# Patient Record
Sex: Male | Born: 1955
Health system: Southern US, Community
[De-identification: ages and names within clinical notes are randomized; demographics above are authoritative.]

## PROBLEM LIST (undated history)

## (undated) DIAGNOSIS — I10 Essential (primary) hypertension: Secondary | ICD-10-CM

## (undated) DIAGNOSIS — E785 Hyperlipidemia, unspecified: Secondary | ICD-10-CM

## (undated) DIAGNOSIS — N289 Disorder of kidney and ureter, unspecified: Secondary | ICD-10-CM

## (undated) DIAGNOSIS — M1990 Unspecified osteoarthritis, unspecified site: Secondary | ICD-10-CM

## (undated) DIAGNOSIS — E611 Iron deficiency: Secondary | ICD-10-CM

## (undated) DIAGNOSIS — E039 Hypothyroidism, unspecified: Secondary | ICD-10-CM

## (undated) DIAGNOSIS — E119 Type 2 diabetes mellitus without complications: Secondary | ICD-10-CM

## (undated) HISTORY — DX: Iron deficiency: E61.1

## (undated) HISTORY — DX: Hyperlipidemia, unspecified: E78.5

## (undated) HISTORY — DX: Type 2 diabetes mellitus without complications: E11.9

## (undated) HISTORY — DX: Hypothyroidism, unspecified: E03.9

## (undated) HISTORY — DX: Unspecified osteoarthritis, unspecified site: M19.90

## (undated) HISTORY — DX: Disorder of kidney and ureter, unspecified: N28.9

---
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
## 2007-04-11 ENCOUNTER — Ambulatory Visit: Payer: Self-pay | Admitting: Nurse Practitioner

## 2007-04-11 ENCOUNTER — Ambulatory Visit: Payer: Self-pay | Admitting: *Deleted

## 2007-04-11 ENCOUNTER — Inpatient Hospital Stay (HOSPITAL_COMMUNITY): Admission: EM | Admit: 2007-04-11 | Discharge: 2007-04-12 | Payer: Self-pay | Admitting: Emergency Medicine

## 2007-04-13 ENCOUNTER — Ambulatory Visit: Payer: Self-pay | Admitting: *Deleted

## 2007-04-19 ENCOUNTER — Ambulatory Visit: Payer: Self-pay | Admitting: Nurse Practitioner

## 2007-12-04 ENCOUNTER — Ambulatory Visit: Payer: Self-pay | Admitting: Internal Medicine

## 2007-12-04 ENCOUNTER — Encounter (INDEPENDENT_AMBULATORY_CARE_PROVIDER_SITE_OTHER): Payer: Self-pay | Admitting: Nurse Practitioner

## 2007-12-04 LAB — CONVERTED CEMR LAB
AST: 29 units/L (ref 0–37)
Alkaline Phosphatase: 78 units/L (ref 39–117)
BUN: 15 mg/dL (ref 6–23)
Basophils Absolute: 0.1 10*3/uL (ref 0.0–0.1)
Basophils Relative: 1 % (ref 0–1)
Calcium: 9.7 mg/dL (ref 8.4–10.5)
Creatinine, Ser: 1.04 mg/dL (ref 0.40–1.50)
Eosinophils Absolute: 0.3 10*3/uL (ref 0.0–0.7)
Eosinophils Relative: 6 % — ABNORMAL HIGH (ref 0–5)
Hemoglobin: 13.4 g/dL (ref 13.0–17.0)
MCHC: 33.9 g/dL (ref 30.0–36.0)
MCV: 95 fL (ref 78.0–100.0)
Microalb, Ur: 0.2 mg/dL (ref 0.00–1.89)
Monocytes Absolute: 0.3 10*3/uL (ref 0.1–1.0)
Monocytes Relative: 5 % (ref 3–12)
RBC: 4.16 M/uL — ABNORMAL LOW (ref 4.22–5.81)
RDW: 13.8 % (ref 11.5–15.5)
Total Bilirubin: 0.5 mg/dL (ref 0.3–1.2)

## 2007-12-10 ENCOUNTER — Ambulatory Visit: Payer: Self-pay | Admitting: Internal Medicine

## 2007-12-10 ENCOUNTER — Encounter (INDEPENDENT_AMBULATORY_CARE_PROVIDER_SITE_OTHER): Payer: Self-pay | Admitting: Nurse Practitioner

## 2007-12-10 LAB — CONVERTED CEMR LAB
Cholesterol: 159 mg/dL (ref 0–200)
HDL: 36 mg/dL — ABNORMAL LOW (ref >39)
Total CHOL/HDL Ratio: 4.4
Triglycerides: 177 mg/dL — ABNORMAL HIGH (ref <150)

## 2008-03-06 ENCOUNTER — Ambulatory Visit: Payer: Self-pay | Admitting: Internal Medicine

## 2008-06-26 ENCOUNTER — Ambulatory Visit: Payer: Self-pay | Admitting: Internal Medicine

## 2008-06-26 ENCOUNTER — Encounter (INDEPENDENT_AMBULATORY_CARE_PROVIDER_SITE_OTHER): Payer: Self-pay | Admitting: Family Medicine

## 2008-06-26 LAB — CONVERTED CEMR LAB: Free T4: 0.67 ng/dL — ABNORMAL LOW (ref 0.89–1.80)

## 2008-07-15 ENCOUNTER — Ambulatory Visit: Payer: Self-pay | Admitting: Internal Medicine

## 2008-08-11 ENCOUNTER — Ambulatory Visit: Payer: Self-pay | Admitting: Family Medicine

## 2008-08-11 LAB — CONVERTED CEMR LAB: TSH: 9.824 microintl units/mL — ABNORMAL HIGH (ref 0.350–4.50)

## 2009-01-21 ENCOUNTER — Ambulatory Visit: Payer: Self-pay | Admitting: Family Medicine

## 2009-01-21 ENCOUNTER — Encounter (INDEPENDENT_AMBULATORY_CARE_PROVIDER_SITE_OTHER): Payer: Self-pay | Admitting: Internal Medicine

## 2009-01-21 LAB — CONVERTED CEMR LAB
ALT: 22 units/L (ref 0–53)
CO2: 23 meq/L (ref 19–32)
Potassium: 3.8 meq/L (ref 3.5–5.3)
Sodium: 144 meq/L (ref 135–145)
TSH: 3.908 microintl units/mL (ref 0.350–4.50)
Total Bilirubin: 0.7 mg/dL (ref 0.3–1.2)
Total Protein: 7.7 g/dL (ref 6.0–8.3)

## 2009-02-24 ENCOUNTER — Ambulatory Visit: Payer: Self-pay | Admitting: Internal Medicine

## 2009-02-24 ENCOUNTER — Encounter (INDEPENDENT_AMBULATORY_CARE_PROVIDER_SITE_OTHER): Payer: Self-pay | Admitting: Internal Medicine

## 2009-02-24 LAB — CONVERTED CEMR LAB
Cholesterol: 173 mg/dL (ref 0–200)
Total CHOL/HDL Ratio: 3.6
Triglycerides: 126 mg/dL (ref <150)
VLDL: 25 mg/dL (ref 0–40)

## 2009-04-20 ENCOUNTER — Ambulatory Visit: Payer: Self-pay | Admitting: Internal Medicine

## 2009-12-01 ENCOUNTER — Ambulatory Visit: Payer: Self-pay | Admitting: Internal Medicine

## 2009-12-01 LAB — CONVERTED CEMR LAB
ALT: 18 units/L (ref 0–53)
Alkaline Phosphatase: 75 units/L (ref 39–117)
Basophils Absolute: 0 10*3/uL (ref 0.0–0.1)
Basophils Relative: 1 % (ref 0–1)
LDL Cholesterol: 108 mg/dL — ABNORMAL HIGH (ref 0–99)
MCHC: 34.6 g/dL (ref 30.0–36.0)
Neutro Abs: 2.1 10*3/uL (ref 1.7–7.7)
Neutrophils Relative %: 49 % (ref 43–77)
PSA: 0.34 ng/mL (ref 0.10–4.00)
RBC: 4.4 M/uL (ref 4.22–5.81)
RDW: 13.4 % (ref 11.5–15.5)
Sodium: 137 meq/L (ref 135–145)
Total Bilirubin: 0.8 mg/dL (ref 0.3–1.2)
Total Protein: 7.4 g/dL (ref 6.0–8.3)
Triglycerides: 233 mg/dL — ABNORMAL HIGH (ref <150)
VLDL: 47 mg/dL — ABNORMAL HIGH (ref 0–40)

## 2010-01-13 ENCOUNTER — Ambulatory Visit: Payer: Self-pay | Admitting: Internal Medicine

## 2010-01-20 ENCOUNTER — Ambulatory Visit: Payer: Self-pay | Admitting: Internal Medicine

## 2010-05-14 ENCOUNTER — Ambulatory Visit: Payer: Self-pay | Admitting: Internal Medicine

## 2010-06-08 ENCOUNTER — Encounter (INDEPENDENT_AMBULATORY_CARE_PROVIDER_SITE_OTHER): Payer: Self-pay | Admitting: Family Medicine

## 2010-06-08 ENCOUNTER — Ambulatory Visit: Payer: Self-pay | Admitting: Internal Medicine

## 2010-06-08 LAB — CONVERTED CEMR LAB
Calcium: 9.3 mg/dL (ref 8.4–10.5)
Creatinine, Ser: 1.1 mg/dL (ref 0.40–1.50)
Glucose, Bld: 268 mg/dL — ABNORMAL HIGH (ref 70–99)
Sodium: 137 meq/L (ref 135–145)
TSH: 1.548 microintl units/mL (ref 0.350–4.500)

## 2010-07-13 ENCOUNTER — Ambulatory Visit: Payer: Self-pay | Admitting: Internal Medicine

## 2010-07-13 LAB — HM DIABETES EYE EXAM

## 2010-09-21 ENCOUNTER — Ambulatory Visit: Payer: Self-pay | Admitting: Family Medicine

## 2010-10-08 ENCOUNTER — Encounter (INDEPENDENT_AMBULATORY_CARE_PROVIDER_SITE_OTHER): Payer: Self-pay | Admitting: Family Medicine

## 2011-04-12 NOTE — Discharge Summary (Signed)
NAMEHAYWARD, CASS NO.:  [id number]   MEDICAL RECORD NO.:  [id number]          PATIENT TYPE:  INP   LOCATION:  [id number]                         FACILITY:  [facility]   PHYSICIAN:  Bartholomew Boards, M.D.     DATE OF BIRTH:  1956-05-19   DATE OF ADMISSION:  04/11/2007  DATE OF DISCHARGE:  04/12/2007                               DISCHARGE SUMMARY   CONTINUITY PHYSICIAN:  Dr. Simeon Craft at the [facility] on [facility].   DISCHARGE DIAGNOSES:  1. Diabetes mellitus type 2, new diagnosis.  2. Hypothyroidism, new diagnosis.   DISCHARGE MEDICATIONS:  1. Metformin 500 mg p.o. b.i.d.  2. Synthroid 500 mcg p.o. daily.   FOLLOWUP:  The patient is to follow up with Dr. Simeon Craft at [facility] on  Apr 19, 2007 at 10:45 a.m.  At that time, he will need to have his blood  sugar checked to evaluate for increasing his metformin, as well as  rechecking a TSH to check and see if his Synthroid is adequately dosed.   BRIEF ADMITTING H&P:  The patient is a 55 year old man with no past  medical history, but with a significant family history of diabetes  mellitus type 2, presenting with a 2 to 3 month history of increased  thirst, increased urination, weight loss, blurry vision, occasional  dizziness and occasional dizziness on standing.  The patient went to his  first doctor visit at [facility] where they found his blood sugar to be  unmeasurable.  He was subsequently told to go to the emergency  department to be evaluated.  His blood sugars had also been measuring  500 or more using his significant other's monitor for some time prior to  presentation.  The patient denied chest pain, shortness of breath,  nausea, vomiting or abdominal pain.   PHYSICAL EXAMINATION:  VITAL SIGNS:  Temperature 96.8, blood pressure  135/92, pulse of 68, respirations 18, O2 sat 99% on room air.  GENERAL:  The patient was in no acute distress.  HEENT:  PERRL.  EOMI.  Oropharynx is clear.  NECK:  Supple.   No lymphadenopathy.  LUNGS:  Clear to auscultation.  HEART:  Showed a regular rate and rhythm.  ABDOMEN:  Soft, nontender, nondistended with positive bowel sounds.  EXTREMITIES:  Revealed no clubbing, cyanosis or edema.  2+ distal  pulses.  Had no rashes or erythema.  NEURO:  Normal.  The patient was alert and oriented x3.  Cranial nerves  II-XII intact.  Sensorium was intact to pin prick, as well as soft  touch.  Cerebellar signs were intact, Romberg was downgoing reflexes 2+  and symmetric.  The patient was appropriate and very pleasant.   ADMISSION LABORATORY DATA:  Sodium 127, potassium 4.4, chloride 93,  bicarb 26, BUN 15, creatinine 1.42 with a glucose of 810.  White count  3.6, hemoglobin 11.3, platelets 130.   HOSPITAL COURSE:  1. Diabetes mellitus type 2, new diagnosis:  The patient was      significantly hyperglycemic on admission, but he had no evidence of      GAP on his  laboratories and his bicarb was within normal limits.      He did not appear to be in hyperosmolar nonketotic coma, as his      serum osmolality was also within normal limits of 301.  However,      the patient was severely dehydrated due to his osmotic diuresis.      The patient had BMETs checked every 4 hours to monitor for his      potassium, checked CBGs every one hour.  He was given 6 units of      insulin immediately, and then started on an insulin drip and given      IV fluid resuscitation.  Once his blood sugar came down into the      200s, his insulin drip was discontinued and he was given 10 units      of Lantus and coupled with sliding scale.  On day 2, he was      transitioned to p.o. medications, including metformin 500 mg p.o.      b.i.d., and he is to followup with his outpatient primary care      physician to evaluate this regimen.  2. Hypothyroidism.  The patient was anemic on presentation, and a full      anemia workup was completed, complete with iron studies, RBC folate      and B12,  all which were normal, and then also tested a TSH, which      was highly elevated at 55.  Therefore, he was immediately started      on Synthroid 500 mcg p.o. daily, and this is to be followed up by      his outpatient clinic [facility].  3. Anemia:  The patient's hemoglobin was 11.8 on admission.  After      extensive workup, including iron studies, RBC folate and B12, all      of which were normal, it was concluded that this was secondary to      his hypothyroidism.  This should be followed up by his primary care      physician.  We assume that his blood counts will come up as his      hypothyroidism is treated.  4. Acute renal failure:  On admission, the patient's creatinine was      1.6.  After aggressive fluid resuscitation and treating his      hyperglycemia, his creatinine came down to 1.15 at discharge.   DISCHARGE LABORATORY DATA:  Sodium of 136, potassium 4.3, chloride 108,  bicarb 22, glucose 166, BUN 8, creatinine 1.15.  White blood count was  3.6, hemoglobin 10.4, platelets 118.  Free T4 was 0.46, which is low.  Microalbumin to creatinine ratio was 3.8.      Bartholomew Boards, M.D.  Electronically Signed     CA/MEDQ  D:  04/13/2007  T:  04/13/2007  Job:  [id number]

## 2012-12-27 ENCOUNTER — Encounter: Payer: Self-pay | Admitting: Internal Medicine

## 2012-12-27 ENCOUNTER — Ambulatory Visit (INDEPENDENT_AMBULATORY_CARE_PROVIDER_SITE_OTHER): Payer: 59 | Admitting: Internal Medicine

## 2012-12-27 ENCOUNTER — Other Ambulatory Visit (INDEPENDENT_AMBULATORY_CARE_PROVIDER_SITE_OTHER): Payer: 59

## 2012-12-27 VITALS — BP 130/88 | HR 76 | Temp 97.6°F | Ht 65.35 in | Wt 179.4 lb

## 2012-12-27 DIAGNOSIS — Z13220 Encounter for screening for lipoid disorders: Secondary | ICD-10-CM

## 2012-12-27 DIAGNOSIS — Z0000 Encounter for general adult medical examination without abnormal findings: Secondary | ICD-10-CM

## 2012-12-27 DIAGNOSIS — Z130 Encounter for screening for diseases of the blood and blood-forming organs and certain disorders involving the immune mechanism: Secondary | ICD-10-CM

## 2012-12-27 DIAGNOSIS — Z125 Encounter for screening for malignant neoplasm of prostate: Secondary | ICD-10-CM

## 2012-12-27 DIAGNOSIS — Z23 Encounter for immunization: Secondary | ICD-10-CM

## 2012-12-27 DIAGNOSIS — M129 Arthropathy, unspecified: Secondary | ICD-10-CM

## 2012-12-27 DIAGNOSIS — E119 Type 2 diabetes mellitus without complications: Secondary | ICD-10-CM

## 2012-12-27 DIAGNOSIS — E039 Hypothyroidism, unspecified: Secondary | ICD-10-CM

## 2012-12-27 DIAGNOSIS — Z1211 Encounter for screening for malignant neoplasm of colon: Secondary | ICD-10-CM

## 2012-12-27 DIAGNOSIS — M1990 Unspecified osteoarthritis, unspecified site: Secondary | ICD-10-CM | POA: Insufficient documentation

## 2012-12-27 DIAGNOSIS — G4700 Insomnia, unspecified: Secondary | ICD-10-CM

## 2012-12-27 LAB — MICROALBUMIN / CREATININE URINE RATIO
Creatinine,U: 108.4 mg/dL
Microalb, Ur: 0.6 mg/dL (ref 0.0–1.9)

## 2012-12-27 LAB — BASIC METABOLIC PANEL
BUN: 10 mg/dL (ref 6–23)
CO2: 29 mEq/L (ref 19–32)
Chloride: 104 mEq/L (ref 96–112)
GFR: 54.16 mL/min — ABNORMAL LOW (ref >60.00)
Glucose, Bld: 198 mg/dL — ABNORMAL HIGH (ref 70–99)
Potassium: 4.4 mEq/L (ref 3.5–5.1)

## 2012-12-27 LAB — CBC
MCV: 91.9 fl (ref 78.0–100.0)
Platelets: 158 10*3/uL (ref 150.0–400.0)
RBC: 4.13 Mil/uL — ABNORMAL LOW (ref 4.22–5.81)
WBC: 4.2 10*3/uL — ABNORMAL LOW (ref 4.5–10.5)

## 2012-12-27 LAB — HEMOGLOBIN A1C: Hgb A1c MFr Bld: 10.1 % — ABNORMAL HIGH (ref 4.6–6.5)

## 2012-12-27 LAB — LIPID PANEL
Total CHOL/HDL Ratio: 5
VLDL: 71.6 mg/dL — ABNORMAL HIGH (ref 0.0–40.0)

## 2012-12-27 MED ORDER — TRAMADOL HCL 50 MG PO TABS: 50.0000 mg | ORAL_TABLET | Freq: Three times a day (TID) | ORAL | Status: DC | PRN

## 2012-12-27 MED ORDER — ALPRAZOLAM 1 MG PO TABS: 1.0000 mg | ORAL_TABLET | Freq: Every evening | ORAL | Status: DC | PRN

## 2012-12-27 MED ORDER — METFORMIN HCL 1000 MG PO TABS: 1000.0000 mg | ORAL_TABLET | Freq: Two times a day (BID) | ORAL | Status: DC

## 2012-12-27 MED ORDER — GLIPIZIDE ER 10 MG PO TB24: 10.0000 mg | ORAL_TABLET | Freq: Every morning | ORAL | Status: DC

## 2012-12-27 MED ORDER — MELOXICAM 7.5 MG PO TABS: 15.0000 mg | ORAL_TABLET | Freq: Every day | ORAL | Status: DC

## 2012-12-27 NOTE — Assessment & Plan Note (Signed)
will check HgbA1c and urine microalbumin today Continue metformin and glipizide Continue to check sugars daily

## 2012-12-27 NOTE — Patient Instructions (Signed)
Health Maintenance, Males A healthy lifestyle and preventative care can promote health and wellness.  Maintain regular health, dental, and eye exams.  Eat a healthy diet. Foods like vegetables, fruits, whole grains, low-fat dairy products, and lean protein foods contain the nutrients you need without too many calories. Decrease your intake of foods high in solid fats, added sugars, and salt. Get information about a proper diet from your caregiver, if necessary.  Regular physical exercise is one of the most important things you can do for your health. Most adults should get at least 150 minutes of moderate-intensity exercise (any activity that increases your heart rate and causes you to sweat) each week. In addition, most adults need muscle-strengthening exercises on 2 or more days a week.   Maintain a healthy weight. The body mass index (BMI) is a screening tool to identify possible weight problems. It provides an estimate of body fat based on height and weight. Your caregiver can help determine your BMI, and can help you achieve or maintain a healthy weight. For adults 20 years and older:  A BMI below 18.5 is considered underweight.  A BMI of 18.5 to 24.9 is normal.  A BMI of 25 to 29.9 is considered overweight.  A BMI of 30 and above is considered obese.  Maintain normal blood lipids and cholesterol by exercising and minimizing your intake of saturated fat. Eat a balanced diet with plenty of fruits and vegetables. Blood tests for lipids and cholesterol should begin at age 20 and be repeated every 5 years. If your lipid or cholesterol levels are high, you are over 50, or you are a high risk for heart disease, you may need your cholesterol levels checked more frequently.Ongoing high lipid and cholesterol levels should be treated with medicines, if diet and exercise are not effective.  If you smoke, find out from your caregiver how to quit. If you do not use tobacco, do not start.  If you  choose to drink alcohol, do not exceed 2 drinks per day. One drink is considered to be 12 ounces (355 mL) of beer, 5 ounces (148 mL) of wine, or 1.5 ounces (44 mL) of liquor.  Avoid use of street drugs. Do not share needles with anyone. Ask for help if you need support or instructions about stopping the use of drugs.  High blood pressure causes heart disease and increases the risk of stroke. Blood pressure should be checked at least every 1 to 2 years. Ongoing high blood pressure should be treated with medicines if weight loss and exercise are not effective.  If you are 45 to 57 years old, ask your caregiver if you should take aspirin to prevent heart disease.  Diabetes screening involves taking a blood sample to check your fasting blood sugar level. This should be done once every 3 years, after age 45, if you are within normal weight and without risk factors for diabetes. Testing should be considered at a younger age or be carried out more frequently if you are overweight and have at least 1 risk factor for diabetes.  Colorectal cancer can be detected and often prevented. Most routine colorectal cancer screening begins at the age of 50 and continues through age 75. However, your caregiver may recommend screening at an earlier age if you have risk factors for colon cancer. On a yearly basis, your caregiver may provide home test kits to check for hidden blood in the stool. Use of a small camera at the end of a tube,   to directly examine the colon (sigmoidoscopy or colonoscopy), can detect the earliest forms of colorectal cancer. Talk to your caregiver about this at age 50, when routine screening begins. Direct examination of the colon should be repeated every 5 to 10 years through age 75, unless early forms of pre-cancerous polyps or small growths are found.  Hepatitis C blood testing is recommended for all people born from 1945 through 1965 and any individual with known risks for hepatitis C.  Healthy  men should no longer receive prostate-specific antigen (PSA) blood tests as part of routine cancer screening. Consult with your caregiver about prostate cancer screening.  Testicular cancer screening is not recommended for adolescents or adult males who have no symptoms. Screening includes self-exam, caregiver exam, and other screening tests. Consult with your caregiver about any symptoms you have or any concerns you have about testicular cancer.  Practice safe sex. Use condoms and avoid high-risk sexual practices to reduce the spread of sexually transmitted infections (STIs).  Use sunscreen with a sun protection factor (SPF) of 30 or greater. Apply sunscreen liberally and repeatedly throughout the day. You should seek shade when your shadow is shorter than you. Protect yourself by wearing long sleeves, pants, a wide-brimmed hat, and sunglasses year round, whenever you are outdoors.  Notify your caregiver of new moles or changes in moles, especially if there is a change in shape or color. Also notify your caregiver if a mole is larger than the size of a pencil eraser.  A one-time screening for abdominal aortic aneurysm (AAA) and surgical repair of large AAAs by sound wave imaging (ultrasonography) is recommended for ages 65 to 75 years who are current or former smokers.  Stay current with your immunizations. Document Released: 05/12/2008 Document Revised: 02/06/2012 Document Reviewed: 04/11/2011 Exit[employer] Patient Information 2013 [employer].  

## 2012-12-27 NOTE — Assessment & Plan Note (Signed)
  Will check TSH today Will restart Synthroid based on your levels

## 2012-12-27 NOTE — Assessment & Plan Note (Signed)
  Continue Mobic daily eRx for tramadol Do stretching exercises as shown on handout Will not prescribe oxycontin for chronic pain. If patient feels he needs this, will refer to pain management

## 2012-12-27 NOTE — Assessment & Plan Note (Addendum)
   Preventative Health Maintenance:  Start a diet and exercise program PT given flu, tdap, and pneumovax today Make an appointment to see an eye doctor Make an appointment to see a dentist Will set up for colonoscopy Basic screening labs today Take xanax 1 mg QHS prn for sleep Practice healthy sleep habits

## 2012-12-27 NOTE — Progress Notes (Signed)
HPI  Pt presents to the clinic today to establish care. He used to be seen at [facility] before they closed. He does have some concerns today about insomnia. He usually sleeps about 4 hours, wakes up to go to the bathroom but then cannot go back to sleep. He is starting to feel sleepy during the day. He has not taken anything to try to help with this. He has never had problems sleeping before. He does not feel stressed. He does have some concerns about chronic lower back pain that he was told was due to arthritis. He has been taking Mobic for it but states that it does not help. He stands up all day at work and states that his back hurts worse when bends over. He thinks he needs to be switched to oxycontin. Additionally, he does have DM. He checks is sugars daily. They run between 150-220. He ran out of his diabetes meds after [facility]. He has been getting glipizide and metformin from someone he works with although he says his pills look completely different. He needs refills today.  Flu: never Tetanus: more than  10 years Pneumovax: never Eye doctor: never Dentis: never Colonoscopy: never  Past Medical History  Diagnosis Date  . Diabetes mellitus without complication   . Hypothyroidism     Current Outpatient Prescriptions  Medication Sig Dispense Refill  . glipiZIDE (GLUCOTROL XL) 10 MG 24 hr tablet Take 10 mg by mouth every morning.       Marland Kitchen levothyroxine (SYNTHROID, LEVOTHROID) 100 MCG tablet Take 100 mcg by mouth daily.      . meloxicam (MOBIC) 7.5 MG tablet Take 7.5 mg by mouth daily.      . metFORMIN (GLUCOPHAGE) 1000 MG tablet Take 1,000 mg by mouth 2 (two) times daily with a meal.         No Known Allergies  Family History  Problem Relation Age of Onset  . Stroke Mother   . Heart disease Mother   . Kidney disease Mother   . Hypertension Mother   . Diabetes Mother   . Cancer Mother   . Alcohol abuse Father     History   Social History  . Marital Status:  Single    Spouse Name: N/A    Number of Children: N/A  . Years of Education: 12   Occupational History  .     Social History Main Topics  . Smoking status: Former Research scientist (life sciences)  . Smokeless tobacco: Never Used  . Alcohol Use: No  . Drug Use: No  . Sexually Active: Yes   Other Topics Concern  . Not on file   Social History Narrative   Regular exercise-noCaffeine Use-yes    ROS:  Constitutional: Denies fever, malaise, fatigue, headache or abrupt weight changes.  HEENT: Denies eye pain, eye redness, ear pain, ringing in the ears, wax buildup, runny nose, nasal congestion, bloody nose, or sore throat. Respiratory: Denies difficulty breathing, shortness of breath, cough or sputum production.   Cardiovascular: Denies chest pain, chest tightness, palpitations or swelling in the hands or feet.  Gastrointestinal: Denies abdominal pain, bloating, constipation, diarrhea or blood in the stool.  GU: Denies frequency, urgency, pain with urination, blood in urine, odor or discharge. Musculoskeletal: Pt reports chronic back pain. Denies decrease in range of motion, difficulty with gait, muscle pain or joint pain and swelling.  Skin: Denies redness, rashes, lesions or ulcercations.  Neurological: Denies dizziness, difficulty with memory, difficulty with speech or problems with  balance and coordination.   No other specific complaints in a complete review of systems (except as listed in HPI above).  PE:  BP 130/88  Pulse 76  Temp 97.6 F (36.4 C) (Oral)  Ht 5' 5.35" (1.66 m)  Wt 179 lb 6.4 oz (81.375 kg)  BMI 29.53 kg/m2  SpO2 97% Wt Readings from Last 3 Encounters:  12/27/12 179 lb 6.4 oz (81.375 kg)    General: Appears his stated age, well developed, well nourished in NAD. HEENT: Head: normal shape and size; Eyes: sclera white, no icterus, conjunctiva pink, PERRLA and EOMs intact; Ears: Tm's gray and intact, normal light reflex; Nose: mucosa pink and moist, septum midline; Throat/Mouth:  Teeth present, mucosa pink and moist, no lesions or ulcerations noted.  Neck: Normal range of motion. Neck supple, trachea midline. No massses, lumps or thyromegaly present.  Cardiovascular: Normal rate and rhythm. S1,S2 noted.  No murmur, rubs or gallops noted. No JVD or BLE edema. No carotid bruits noted. Pulmonary/Chest: Normal effort and positive vesicular breath sounds. No respiratory distress. No wheezes, rales or ronchi noted.  Abdomen: Soft and nontender. Normal bowel sounds, no bruits noted. No distention or masses noted. Liver, spleen and kidneys non palpable. Musculoskeletal: Normal range of motion. No signs of joint swelling. No difficulty with gait.  Neurological: Alert and oriented. Cranial nerves II-XII intact. Coordination normal. +DTRs bilaterally. Psychiatric: Mood and affect normal. Behavior is normal. Judgment and thought content normal.      Assessment and Plan:   RTC in 3 months for diabetes follow up

## 2012-12-28 ENCOUNTER — Telehealth: Payer: Self-pay | Admitting: Internal Medicine

## 2012-12-28 NOTE — Telephone Encounter (Signed)
Patient is requesting a call back to discuss his lab results

## 2012-12-28 NOTE — Telephone Encounter (Signed)
Pt informed of lab results and NP's advisement and transferred to scheduler to set up 1 month F/U to recheck labs. Appointment scheduled 01/22/2013 at 8:00am.

## 2012-12-31 ENCOUNTER — Other Ambulatory Visit: Payer: Self-pay | Admitting: Internal Medicine

## 2012-12-31 ENCOUNTER — Telehealth: Payer: Self-pay | Admitting: Internal Medicine

## 2012-12-31 DIAGNOSIS — E039 Hypothyroidism, unspecified: Secondary | ICD-10-CM

## 2012-12-31 MED ORDER — LEVOTHYROXINE SODIUM 100 MCG PO TABS: 100.0000 ug | ORAL_TABLET | Freq: Every day | ORAL | Status: DC

## 2012-12-31 NOTE — Telephone Encounter (Signed)
Patient needs his synthroid called in to [facility]

## 2012-12-31 NOTE — Telephone Encounter (Signed)
Ash, eRx done [facility]

## 2012-12-31 NOTE — Telephone Encounter (Signed)
Pt informed of rx sent to [place].

## 2013-01-17 ENCOUNTER — Other Ambulatory Visit: Payer: Self-pay | Admitting: Internal Medicine

## 2013-01-21 ENCOUNTER — Other Ambulatory Visit: Payer: Self-pay | Admitting: Internal Medicine

## 2013-01-22 ENCOUNTER — Ambulatory Visit (INDEPENDENT_AMBULATORY_CARE_PROVIDER_SITE_OTHER): Payer: 59 | Admitting: Internal Medicine

## 2013-01-22 ENCOUNTER — Other Ambulatory Visit (INDEPENDENT_AMBULATORY_CARE_PROVIDER_SITE_OTHER): Payer: 59

## 2013-01-22 ENCOUNTER — Other Ambulatory Visit: Payer: Self-pay | Admitting: Internal Medicine

## 2013-01-22 ENCOUNTER — Encounter: Payer: Self-pay | Admitting: Internal Medicine

## 2013-01-22 ENCOUNTER — Telehealth: Payer: Self-pay | Admitting: *Deleted

## 2013-01-22 VITALS — BP 134/86 | HR 80 | Temp 98.0°F | Ht 65.35 in | Wt 172.5 lb

## 2013-01-22 DIAGNOSIS — G4700 Insomnia, unspecified: Secondary | ICD-10-CM

## 2013-01-22 DIAGNOSIS — E039 Hypothyroidism, unspecified: Secondary | ICD-10-CM

## 2013-01-22 DIAGNOSIS — M1990 Unspecified osteoarthritis, unspecified site: Secondary | ICD-10-CM

## 2013-01-22 DIAGNOSIS — E119 Type 2 diabetes mellitus without complications: Secondary | ICD-10-CM

## 2013-01-22 DIAGNOSIS — M79609 Pain in unspecified limb: Secondary | ICD-10-CM

## 2013-01-22 DIAGNOSIS — M129 Arthropathy, unspecified: Secondary | ICD-10-CM

## 2013-01-22 DIAGNOSIS — M79675 Pain in left toe(s): Secondary | ICD-10-CM

## 2013-01-22 DIAGNOSIS — N529 Male erectile dysfunction, unspecified: Secondary | ICD-10-CM

## 2013-01-22 LAB — BASIC METABOLIC PANEL
CO2: 28 mEq/L (ref 19–32)
Calcium: 9.5 mg/dL (ref 8.4–10.5)
Chloride: 102 mEq/L (ref 96–112)
Creatinine, Ser: 1.4 mg/dL (ref 0.4–1.5)
Glucose, Bld: 217 mg/dL — ABNORMAL HIGH (ref 70–99)

## 2013-01-22 LAB — TSH: TSH: 8.18 u[IU]/mL — ABNORMAL HIGH (ref 0.35–5.50)

## 2013-01-22 MED ORDER — LEVOTHYROXINE SODIUM 112 MCG PO TABS: 112.0000 ug | ORAL_TABLET | Freq: Every day | ORAL | Status: DC

## 2013-01-22 MED ORDER — SILDENAFIL CITRATE 100 MG PO TABS: 100.0000 mg | ORAL_TABLET | Freq: Every day | ORAL | Status: DC | PRN

## 2013-01-22 MED ORDER — HYDROCODONE-ACETAMINOPHEN 10-325 MG PO TABS: 1.0000 | ORAL_TABLET | Freq: Three times a day (TID) | ORAL | Status: DC | PRN

## 2013-01-22 MED ORDER — ALPRAZOLAM 1 MG PO TABS: 1.0000 mg | ORAL_TABLET | Freq: Every evening | ORAL | Status: DC | PRN

## 2013-01-22 NOTE — Progress Notes (Signed)
Subjective:    Patient ID: Donald King, male    DOB: 10-Jun-1956, 57 y.o.   MRN: [id number]  HPI  Pt presents to the clinic today to follow up on his TSH. At his initial visit, labs were drawn and his TSH was 147. He had been off of his medication for a few months. We restarted his Levothyroxine 100 mcg after his labs came back. He is here to have his levels rechecked today. Since he has been back on the medication, he feels less fatigued and tired. He is tolerating the medication well Additionally, the patient c/o of trouble initiating and maintaining an erection. He has been on Viagra in the past for this. He has run out of his prescription. He is requesting a new RX today. He also c/o arthritis in the left toe. He states that the Ultram and Advil do not work. He is requesting something stronger for pain. He has had this arthritis for a number of years. He is able to walk but by the end of the day, his pain is really bad. He also wishes to have refills of his sleeping medication today.  Review of Systems      Past Medical History  Diagnosis Date  . Diabetes mellitus without complication   . Hypothyroidism     Current Outpatient Prescriptions  Medication Sig Dispense Refill  . ALPRAZolam (XANAX) 1 MG tablet Take 1 tablet (1 mg total) by mouth at bedtime as needed for sleep.  30 tablet  0  . glipiZIDE (GLUCOTROL XL) 10 MG 24 hr tablet Take 1 tablet (10 mg total) by mouth every morning.  30 tablet  2  . levothyroxine (SYNTHROID, LEVOTHROID) 100 MCG tablet Take 1 tablet (100 mcg total) by mouth daily.  30 tablet  2  . meloxicam (MOBIC) 7.5 MG tablet Take 2 tablets (15 mg total) by mouth daily.  30 tablet  1  . metFORMIN (GLUCOPHAGE) 1000 MG tablet Take 1 tablet (1,000 mg total) by mouth 2 (two) times daily with a meal.  60 tablet  2  . traMADol (ULTRAM) 50 MG tablet TAKE ONE TABLET BY MOUTH EVERY 8 HOURS AS NEEDED FOR PAIN  30 tablet  0  . traMADol (ULTRAM) 50 MG tablet TAKE ONE TABLET BY  MOUTH EVERY 8 HOURS AS NEEDED FOR PAIN  30 tablet  0   No current facility-administered medications for this visit.    No Known Allergies  Family History  Problem Relation Age of Onset  . Stroke Mother   . Heart disease Mother   . Kidney disease Mother   . Hypertension Mother   . Diabetes Mother   . Cancer Mother   . Alcohol abuse Father     History   Social History  . Marital Status: Single    Spouse Name: N/A    Number of Children: N/A  . Years of Education: 12   Occupational History  .     Social History Main Topics  . Smoking status: Former Research scientist (life sciences)  . Smokeless tobacco: Never Used  . Alcohol Use: No  . Drug Use: No  . Sexually Active: Yes   Other Topics Concern  . Not on file   Social History Narrative   Regular exercise-no   Caffeine Use-yes           Constitutional: Denies fever, malaise, fatigue, headache or abrupt weight changes.  Respiratory: Denies difficulty breathing, shortness of breath, cough or sputum production.   Cardiovascular: Denies chest pain, chest  tightness, palpitations or swelling in the hands or feet.  Musculoskeletal: Denies decrease in range of motion, difficulty with gait, muscle pain or joint swelling.  Skin: Denies redness, rashes, lesions or ulcercations.    No other specific complaints in a complete review of systems (except as listed in HPI above).  Objective:   Physical Exam   BP 134/86  Pulse 80  Temp(Src) 98 F (36.7 C) (Oral)  Ht 5' 5.35" (1.66 m)  Wt 172 lb 8 oz (78.245 kg)  BMI 28.39 kg/m2  SpO2 98% Wt Readings from Last 3 Encounters:  01/22/13 172 lb 8 oz (78.245 kg)  12/27/12 179 lb 6.4 oz (81.375 kg)    General: Appears his stated age, well developed, well nourished in NAD. Skin: Warm, dry and intact. No rashes, lesions or ulcerations noted. Cardiovascular: Normal rate and rhythm. S1,S2 noted.  No murmur, rubs or gallops noted. No JVD or BLE edema. No carotid bruits noted. Pulmonary/Chest: Normal  effort and positive vesicular breath sounds. No respiratory distress. No wheezes, rales or ronchi noted.  Musculoskeletal: Normal range of motion. No signs of joint swelling. No difficulty with gait.    BMET    Component Value Date/Time   NA 140 12/27/2012 1125   K 4.4 12/27/2012 1125   CL 104 12/27/2012 1125   CO2 29 12/27/2012 1125   GLUCOSE 198* 12/27/2012 1125   BUN 10 12/27/2012 1125   CREATININE 1.7* 12/27/2012 1125   CALCIUM 9.5 12/27/2012 1125    Lipid Panel     Component Value Date/Time   CHOL 263* 12/27/2012 1125   TRIG 358.0* 12/27/2012 1125   HDL 56.10 12/27/2012 1125   CHOLHDL 5 12/27/2012 1125   VLDL 71.6* 12/27/2012 1125   LDLCALC 108* 12/01/2009 2007    CBC    Component Value Date/Time   WBC 4.2* 12/27/2012 1125   RBC 4.13* 12/27/2012 1125   HGB 12.7* 12/27/2012 1125   HCT 37.9* 12/27/2012 1125   PLT 158.0 12/27/2012 1125   MCV 91.9 12/27/2012 1125   MCHC 33.6 12/27/2012 1125   RDW 15.5* 12/27/2012 1125   LYMPHSABS 1.8 12/01/2009 2007   MONOABS 0.2 12/01/2009 2007   EOSABS 0.2 12/01/2009 2007   BASOSABS 0.0 12/01/2009 2007    Hgb A1C Lab Results  Component Value Date   HGBA1C 10.1* 12/27/2012        Assessment & Plan:   RTC in 2 months for f/u on DM

## 2013-01-22 NOTE — Assessment & Plan Note (Signed)
Will recheck TSH today Will titrate medication based on levels

## 2013-01-22 NOTE — Assessment & Plan Note (Signed)
Likely secondary to DM@ eRx for Viagra 100 mg

## 2013-01-22 NOTE — Telephone Encounter (Signed)
Echo informed okay to switch to Sandoz brand and of 156mcg dosage.

## 2013-01-22 NOTE — Assessment & Plan Note (Signed)
Will d/c tramadol at this time due to ineffectiveness eRx for Vicodin 10-325

## 2013-01-22 NOTE — Telephone Encounter (Signed)
[facility] sent fax asking if they can switch from a [employer] brand of Levothyroxine to a Sandoz brand. They also wanted to clarify dosage increasing from 173mcg to 112 mcg.

## 2013-01-22 NOTE — Assessment & Plan Note (Signed)
Well controlled on current meds Xanax refilled today, no early refills

## 2013-01-22 NOTE — Telephone Encounter (Signed)
Ash, OK to switch brands. Yes I want the 112 mcg dose. Rollene Fare

## 2013-01-22 NOTE — Assessment & Plan Note (Signed)
Will recheck kidney function today

## 2013-01-22 NOTE — Patient Instructions (Signed)

## 2013-02-04 ENCOUNTER — Telehealth: Payer: Self-pay | Admitting: Internal Medicine

## 2013-02-04 NOTE — Telephone Encounter (Signed)
Appointment scheduled 02/05/2013 at 8:15am.

## 2013-02-04 NOTE — Telephone Encounter (Signed)
Ash, If he is in such sever pain, I recommend that he be seen in the office. If the arthritis is this bad in his toe, then he needs to be referred to a pain clinic, because I do not do chronic pain management. I just refilled his pain medicine 13 days ago. If he would like the referral, please let me know and  I can put it in for him. Rollene Fare

## 2013-02-04 NOTE — Telephone Encounter (Signed)
Patient Information:  Caller Name: Safir  Phone: [phone]  Patient: Donald King, Donald King  Gender: Male  DOB: 15-Jan-1956  Age: 57 Years  PCP: Webb Silversmith  Office Follow Up:  Does the office need to follow up with this patient?: Yes  Instructions For The Office: Pt is in severe pain (10) on the pain scale) from his big left toe.  He is requesting a refill of his Hydrocodoone 10/325 1 PO Q 8 hrs.  Last filled 01/22/13.  Denied office visit.  Please call him if/when this is called in so he can go get it.  TY!   Symptoms  Reason For Call & Symptoms: Pt calling for more pain medication for his Left big toe.  Norco 10/325 was prescribed 01/22/13 for 30 pills to take 1 Q 8 hrs  PRN.  NOw he is out and requesting more.  It is a yellow pill, stromger than the Tramadol.  Reviewed Health History In EMR: Yes  Reviewed Medications In EMR: Yes  Reviewed Allergies In EMR: Yes  Reviewed Surgeries / Procedures: Yes  Date of Onset of Symptoms: 02/01/2013  Guideline(s) Used:  Foot Pain  Disposition Per Guideline:   See Today in Office  Reason For Disposition Reached:   Severe pain (e.g., excruciating, unable to do any normal activities)  Advice Given:  Pain Medicines:  For pain relief, you can take either acetaminophen, ibuprofen, or naproxen. States he has tried these and gets no relief.   They are over-the-counter (OTC) pain drugs. You can buy them at the drugstore.  Call Back If:  Swelling, redness, or fever occur  Severe pain not relieved by pain medication  Pain lasts over 7 days  You become worse.  RN Overrode Recommendation:  Patient Requests Prescription  He is requesting a refill for his Hydrocodone (a yellow pill he gets from [facility]) 10/325 1 PO Q 8 hrs for left big toe pain.  Last prescribed 01/22/13.  Denied office visit.

## 2013-02-04 NOTE — Telephone Encounter (Signed)
Pt informed of NP's advisement. Transferred to scheduler to set up appointment.

## 2013-02-05 ENCOUNTER — Ambulatory Visit (INDEPENDENT_AMBULATORY_CARE_PROVIDER_SITE_OTHER): Payer: 59 | Admitting: Internal Medicine

## 2013-02-05 ENCOUNTER — Encounter: Payer: Self-pay | Admitting: Internal Medicine

## 2013-02-05 ENCOUNTER — Ambulatory Visit (INDEPENDENT_AMBULATORY_CARE_PROVIDER_SITE_OTHER)
Admission: RE | Admit: 2013-02-05 | Discharge: 2013-02-05 | Disposition: A | Discharge status: Home / Self Care | Payer: 59 | Source: Ambulatory Visit | Attending: Internal Medicine | Admitting: Internal Medicine

## 2013-02-05 VITALS — BP 108/62 | HR 68 | Temp 97.8°F | Ht 65.35 in | Wt 173.2 lb

## 2013-02-05 DIAGNOSIS — M545 Low back pain: Secondary | ICD-10-CM

## 2013-02-05 DIAGNOSIS — T148XXA Other injury of unspecified body region, initial encounter: Secondary | ICD-10-CM

## 2013-02-05 DIAGNOSIS — M129 Arthropathy, unspecified: Secondary | ICD-10-CM

## 2013-02-05 DIAGNOSIS — M1990 Unspecified osteoarthritis, unspecified site: Secondary | ICD-10-CM

## 2013-02-05 MED ORDER — HYDROCODONE-ACETAMINOPHEN 10-325 MG PO TABS: 1.0000 | ORAL_TABLET | Freq: Three times a day (TID) | ORAL | Status: DC | PRN

## 2013-02-05 MED ORDER — CYCLOBENZAPRINE HCL 10 MG PO TABS: 10.0000 mg | ORAL_TABLET | Freq: Three times a day (TID) | ORAL | Status: DC | PRN

## 2013-02-05 NOTE — Progress Notes (Signed)
Subjective:    Patient ID: Donald King, male    DOB: 1956-04-22, 57 y.o.   MRN: [id number]  HPI  Pt presents to the clinic today with c/o left big toe pain and lower back pain. The pain in his back is worse wen he bends or twist. He has had these pains for a few years. 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 He has been taking his Norco but does report that he takes more than prescribed, usually 4 per day. He states that he needs it to get through his work day. I have discussed with him about the benefits of pain clinics, which he has refused referral in the past.    Review of Systems  Past Medical History  Diagnosis Date  . Diabetes mellitus without complication   . Hypothyroidism     Current Outpatient Prescriptions  Medication Sig Dispense Refill  . ALPRAZolam (XANAX) 1 MG tablet Take 1 tablet (1 mg total) by mouth at bedtime as needed for sleep.  30 tablet  0  . glipiZIDE (GLUCOTROL XL) 10 MG 24 hr tablet Take 1 tablet (10 mg total) by mouth every morning.  30 tablet  2  . levothyroxine (SYNTHROID, LEVOTHROID) 112 MCG tablet Take 1 tablet (112 mcg total) by mouth daily.  90 tablet  3  . meloxicam (MOBIC) 7.5 MG tablet Take 2 tablets (15 mg total) by mouth daily.  30 tablet  1  . metFORMIN (GLUCOPHAGE) 1000 MG tablet Take 1 tablet (1,000 mg total) by mouth 2 (two) times daily with a meal.  60 tablet  2  . cyclobenzaprine (FLEXERIL) 10 MG tablet Take 1 tablet (10 mg total) by mouth 3 (three) times daily as needed for muscle spasms.  30 tablet  0  . HYDROcodone-acetaminophen (NORCO) 10-325 MG per tablet Take 1 tablet by mouth every 8 (eight) hours as needed for pain.  90 tablet  0  . sildenafil (VIAGRA) 100 MG tablet Take 1 tablet (100 mg total) by mouth daily as needed for erectile dysfunction.  10 tablet  0   No current facility-administered medications for this visit.    No Known  Allergies  Family History  Problem Relation Age of Onset  . Stroke Mother   . Heart disease Mother   . Kidney disease Mother   . Hypertension Mother   . Diabetes Mother   . Cancer Mother   . Alcohol abuse Father     History   Social History  . Marital Status: Single    Spouse Name: N/A    Number of Children: N/A  . Years of Education: 12   Occupational History  .     Social History Main Topics  . Smoking status: Former Research scientist (life sciences)  . Smokeless tobacco: Never Used  . Alcohol Use: No  . Drug Use: No  . Sexually Active: Yes   Other Topics Concern  . Not on file   Social History Narrative   Regular exercise-no   Caffeine Use-yes           Constitutional: Denies fever, malaise, fatigue, headache or abrupt weight changes.  Musculoskeletal: Pt reports left big toe pain an back pain. Denies decrease in range of motion, difficulty with gait or joint swelling.  Neurological: Denies numbness or tingling in the feet, dizziness, difficulty with memory, difficulty with speech or problems with balance and coordination.   No other  specific complaints in a complete review of systems (except as listed in HPI above).     Objective:   Physical Exam    BP 108/62  Pulse 68  Temp(Src) 97.8 F (36.6 C) (Oral)  Ht 5' 5.35" (1.66 m)  Wt 173 lb 3.2 oz (78.563 kg)  BMI 28.51 kg/m2  SpO2 97% Wt Readings from Last 3 Encounters:  02/05/13 173 lb 3.2 oz (78.563 kg)  01/22/13 172 lb 8 oz (78.245 kg)  12/27/12 179 lb 6.4 oz (81.375 kg)    General: Appears his stated age, well developed, well nourished in NAD.  Cardiovascular: Normal rate and rhythm. S1,S2 noted.  No murmur, rubs or gallops noted. No JVD or BLE edema. No carotid bruits noted. Pulmonary/Chest: Normal effort and positive vesicular breath sounds. No respiratory distress. No wheezes, rales or ronchi noted.  Musculoskeletal: Normal range of motion. No signs of joint swelling. No difficulty with gait.  Neurological: Alert  and oriented. Cranial nerves II-XII intact. Coordination normal. +DTRs bilaterally.      Assessment & Plan:   Arthritis of left great toe, new onset with additional workup required:  Will xray left big toe Will refill Norco # 90, no refills  Lumbago with muscle strain, new onset with additional workup required:  Will obtain xray of lumbar spine to r/o bony deformity eRx for Flexeril 10 mg TID prn Amb referral to pain clinic for further evaluation and management  RTC is pain is sever, or if you lose bowel or bladder control

## 2013-02-05 NOTE — Patient Instructions (Signed)
Muscle Strain °Muscle strain occurs when a muscle is stretched beyond its normal length. A small number of muscle fibers generally are torn. This is especially common in athletes. This happens when a sudden, violent force placed on a muscle stretches it too far. Usually, recovery from muscle strain takes 1 to 2 weeks. Complete healing will take 5 to 6 weeks.  °HOME CARE INSTRUCTIONS  °· While awake, apply ice to the sore muscle for the first 2 days after the injury. °· Put ice in a plastic bag. °· Place a towel between your skin and the bag. °· Leave the ice on for 15 to 20 minutes each hour. °· Do not use the strained muscle for several days, until you no longer have pain. °· You may wrap the injured area with an elastic bandage for comfort. Be careful not to wrap it too tightly. This may interfere with blood circulation or increase swelling. °· Only take over-the-counter or prescription medicines for pain, discomfort, or fever as directed by your caregiver. °SEEK MEDICAL CARE IF:  °You have increasing pain or swelling in the injured area. °MAKE SURE YOU:  °· Understand these instructions. °· Will watch your condition. °· Will get help right away if you are not doing well or get worse. °Document Released: 11/14/2005 Document Revised: 02/06/2012 Document Reviewed: 11/26/2011 °Exit[employer]® Patient Information ©2013 [employer]. ° °

## 2013-02-14 ENCOUNTER — Encounter: Payer: Self-pay | Admitting: Physical Medicine & Rehabilitation

## 2013-02-14 ENCOUNTER — Encounter: Payer: 59 | Admitting: Internal Medicine

## 2013-02-26 ENCOUNTER — Ambulatory Visit (HOSPITAL_BASED_OUTPATIENT_CLINIC_OR_DEPARTMENT_OTHER): Payer: 59 | Admitting: Physical Medicine & Rehabilitation

## 2013-02-26 ENCOUNTER — Encounter: Payer: 59 | Attending: Physical Medicine & Rehabilitation

## 2013-02-26 ENCOUNTER — Encounter: Payer: Self-pay | Admitting: Physical Medicine & Rehabilitation

## 2013-02-26 VITALS — BP 132/77 | HR 68 | Resp 14 | Ht 66.0 in | Wt 172.2 lb

## 2013-02-26 DIAGNOSIS — M549 Dorsalgia, unspecified: Secondary | ICD-10-CM

## 2013-02-26 DIAGNOSIS — Z79899 Other long term (current) drug therapy: Secondary | ICD-10-CM

## 2013-02-26 DIAGNOSIS — Z5181 Encounter for therapeutic drug level monitoring: Secondary | ICD-10-CM

## 2013-02-26 DIAGNOSIS — M47817 Spondylosis without myelopathy or radiculopathy, lumbosacral region: Secondary | ICD-10-CM

## 2013-02-26 DIAGNOSIS — M79609 Pain in unspecified limb: Secondary | ICD-10-CM | POA: Insufficient documentation

## 2013-02-26 DIAGNOSIS — M19079 Primary osteoarthritis, unspecified ankle and foot: Secondary | ICD-10-CM

## 2013-02-26 DIAGNOSIS — M5137 Other intervertebral disc degeneration, lumbosacral region: Secondary | ICD-10-CM | POA: Insufficient documentation

## 2013-02-26 DIAGNOSIS — M51379 Other intervertebral disc degeneration, lumbosacral region without mention of lumbar back pain or lower extremity pain: Secondary | ICD-10-CM | POA: Insufficient documentation

## 2013-02-26 DIAGNOSIS — M545 Low back pain, unspecified: Secondary | ICD-10-CM | POA: Insufficient documentation

## 2013-02-26 MED ORDER — METHYLPREDNISOLONE 4 MG PO KIT: PACK | ORAL | Status: DC

## 2013-02-26 MED ORDER — TRAMADOL HCL 50 MG PO TABS: 50.0000 mg | ORAL_TABLET | Freq: Three times a day (TID) | ORAL | Status: DC | PRN

## 2013-02-26 NOTE — Patient Instructions (Signed)
Physical therapy Medrol Dosepak Tramadol for pain Check urine drug screen Tape your big toe and the toe next to it together

## 2013-02-26 NOTE — Progress Notes (Signed)
Subjective:    Patient ID: Donald King, male    DOB: 04/20/1956, 57 y.o.   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 No bowel or bladder dysfunction No fevers or chills. No history of carcinoma Left great toe pain started some years ago,X-rays performed No history of gout No history of trauma Pain Inventory Average Pain 7 Pain Right Now 8 My pain is aching  In the last 24 hours, has pain interfered with the following? General activity 5 Relation with others 0 Enjoyment of life 7 What TIME of day is your pain at its worst? morning and night Sleep (in general) Good  Pain is worse with: walking, bending and some activites Pain improves with: medication Relief from Meds: 0  Mobility walk without assistance how many minutes can you walk? 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  no LS Spine 02/05/2013 Findings: There are five non-rib bearing lumbar-type vertebral  bodies. There is abnormal narrowing of the intervertebral disc  space at the level of L4-L5. There is marginal osteophyte  formation representing degenerative spondylosis. No fracture or  bony destruction is evident. No pars defect is seen. There is  apophyseal degenerative spondylosis at L5-S1 level. SI joints  appear intact. There is fecal distention of portions of the colon.  IMPRESSION:  Changes of degenerative disc disease and degenerative spondylosis  are present. Fecal distention of portions of the colon  LEFT GREAT TOE 02/05/2013 Comparison: None.  Findings: Three views of the left great toe demonstrate moderate  degenerative changes of the first MTP joint. There are no erosive  changes. There is no fracture or dislocation. Soft tissues  are  otherwise unremarkable.  IMPRESSION:  Moderate osteoarthritic change of the first MTP joint.   Physicians involved in your care Any changes since last visit?  no   Family History  Problem Relation Age of Onset  . Stroke Mother   . Heart disease Mother   . Kidney disease Mother   . Hypertension Mother   . Diabetes Mother   . Cancer Mother   . Alcohol abuse Father    History   Social History  . Marital Status: Single    Spouse Name: N/A    Number of Children: N/A  . Years of Education: 12   Occupational History  .     Social History Main Topics  . Smoking status: Former Research scientist (life sciences)  . Smokeless tobacco: Never Used  . Alcohol Use: No  . Drug Use: No  . Sexually Active: Yes   Other Topics Concern  . None   Social History Narrative   Regular exercise-no   Caffeine Use-yes         Past Surgical History  Procedure Laterality Date  . Appendectomy  1966   Past Medical History  Diagnosis Date  . Diabetes mellitus without complication   . Hypothyroidism    BP 132/77  Pulse 68  Resp 14  Ht 5\' 6"  (1.676 m)  Wt 172 lb 3.2 oz (78.109 kg)  BMI 27.81 kg/m2  SpO2 100%     Review of Systems  Endocrine:       High blood sugars  Genitourinary:       Bladder control problems  Musculoskeletal: Positive for back pain.       Right great toe pain  All other systems reviewed and are negative.       Objective:   Physical Exam  Nursing note and vitals reviewed. Constitutional: He is oriented to person, place, and time. He appears well-developed and well-nourished.  HENT:  Head: Normocephalic and atraumatic.  Eyes: Conjunctivae and EOM are normal. Pupils are equal, round, and reactive to light.  Neck: Normal range of motion.  Musculoskeletal:       Right hip: Normal.       Left hip: Normal.       Right knee: Normal.       Left knee: Normal.       Right ankle: Normal.       Left ankle: Normal.       Lumbar back: He exhibits decreased range of motion,  tenderness and pain. He exhibits no bony tenderness, no deformity and no spasm.       Right foot: Normal.       Left foot: He exhibits decreased range of motion and tenderness. He exhibits no deformity.  Left first MTP, with reduced range of motion as well as tenderness with range of motion and palpation. No evidence of erythema, no evidence of joint swelling, no hypersensitivity  Neurological: He is alert and oriented to person, place, and time. He has normal strength and normal reflexes. No sensory deficit. Coordination and gait normal.  Psychiatric: He has a normal mood and affect.   Straight leg raising test is negative     Assessment & Plan:  1. Low back pain probably multifactorial. Has a combination of degenerative disc as well as lumbar spondylosis. No clear-cut signs of radiculopathy. No red flags. Recent imaging study noted above. Discussed with patient needs multimodal approach Physical therapy prescribed Tramadol prescribed for pain If tramadol  not helpful and UDS is consistent consider low-dose narcotic analgesics such as hydrocodone  May need a lumbar medial branch block  2. Left first MTP No evidence of gout on exam however this is in the differential diagnosis Will treat with Medrol Dosepak followed by diclofenac gel Buddy tape first and second toes of the left foot Consider rigid foot orthosis to limit MTP range of motion  Followup in 3-4 weeks.

## 2013-03-06 ENCOUNTER — Ambulatory Visit: Payer: 59 | Admitting: Physical Therapy

## 2013-03-22 ENCOUNTER — Encounter: Payer: Self-pay | Admitting: Internal Medicine

## 2013-03-22 ENCOUNTER — Telehealth: Payer: Self-pay

## 2013-03-22 ENCOUNTER — Ambulatory Visit (INDEPENDENT_AMBULATORY_CARE_PROVIDER_SITE_OTHER): Payer: 59 | Admitting: Internal Medicine

## 2013-03-22 ENCOUNTER — Other Ambulatory Visit (INDEPENDENT_AMBULATORY_CARE_PROVIDER_SITE_OTHER): Payer: 59

## 2013-03-22 VITALS — BP 136/82 | HR 68 | Temp 98.0°F | Wt 167.8 lb

## 2013-03-22 DIAGNOSIS — N529 Male erectile dysfunction, unspecified: Secondary | ICD-10-CM

## 2013-03-22 DIAGNOSIS — E039 Hypothyroidism, unspecified: Secondary | ICD-10-CM

## 2013-03-22 DIAGNOSIS — M545 Low back pain: Secondary | ICD-10-CM

## 2013-03-22 DIAGNOSIS — E119 Type 2 diabetes mellitus without complications: Secondary | ICD-10-CM

## 2013-03-22 DIAGNOSIS — E785 Hyperlipidemia, unspecified: Secondary | ICD-10-CM

## 2013-03-22 DIAGNOSIS — M129 Arthropathy, unspecified: Secondary | ICD-10-CM

## 2013-03-22 DIAGNOSIS — M1990 Unspecified osteoarthritis, unspecified site: Secondary | ICD-10-CM

## 2013-03-22 LAB — BASIC METABOLIC PANEL
Calcium: 9.1 mg/dL (ref 8.4–10.5)
Chloride: 104 mEq/L (ref 96–112)
Creatinine, Ser: 1.3 mg/dL (ref 0.4–1.5)
Sodium: 139 mEq/L (ref 135–145)

## 2013-03-22 LAB — CBC
HCT: 39.3 % (ref 39.0–52.0)
MCV: 91.3 fl (ref 78.0–100.0)
Platelets: 172 10*3/uL (ref 150.0–400.0)
RBC: 4.31 Mil/uL (ref 4.22–5.81)
WBC: 4.4 10*3/uL — ABNORMAL LOW (ref 4.5–10.5)

## 2013-03-22 LAB — LIPID PANEL
Cholesterol: 165 mg/dL (ref 0–200)
HDL: 37.7 mg/dL — ABNORMAL LOW (ref >39.00)
Triglycerides: 86 mg/dL (ref 0.0–149.0)

## 2013-03-22 MED ORDER — SILDENAFIL CITRATE 100 MG PO TABS: 100.0000 mg | ORAL_TABLET | Freq: Every day | ORAL | Status: DC | PRN

## 2013-03-22 MED ORDER — HYDROCODONE-ACETAMINOPHEN 10-325 MG PO TABS: 1.0000 | ORAL_TABLET | Freq: Three times a day (TID) | ORAL | Status: DC | PRN

## 2013-03-22 NOTE — Assessment & Plan Note (Signed)
Will recheck lipid profile today May need to start on a statin medication if still elevated

## 2013-03-22 NOTE — Progress Notes (Signed)
Hydrocodone called in to [facility] on [address]

## 2013-03-22 NOTE — Telephone Encounter (Signed)
Message copied by Shelly Coss on Fri Mar 22, 2013 11:59 AM ------      Message from: Jearld Fenton      Created: Fri Mar 22, 2013 10:31 AM       Lysbeth Penner,      Can you please call Mr. Ledsome and let him know that his diabetes and cholesterol are much better know that he is back on the medication. I would like to see him back in 6 months for a follow up appointment.      [name] ------

## 2013-03-22 NOTE — Assessment & Plan Note (Signed)
Pt wishing not to go back to pain clinic Will refill hydrocodone today, no early refills

## 2013-03-22 NOTE — Assessment & Plan Note (Signed)
Will check TSH today Will titrate med based on level

## 2013-03-22 NOTE — Telephone Encounter (Signed)
Pt notified and had no concerns/questions

## 2013-03-22 NOTE — Telephone Encounter (Signed)
He has to call on a monthly basis for refills. We do not put refills on pain medication like that to prevent patients from getting early refills. Rollene Fare

## 2013-03-22 NOTE — Progress Notes (Signed)
Subjective:    Patient ID: Donald King, male    DOB: 02-11-1956, 57 y.o.   MRN: [id number]  HPI  Pt presents to the clinic today for 3 month f/u of hypothyroid, hyperlipidemia and DM. He has been doing well. He feels less fatigued. He is tolerating the medications well without any side effects. He will have his labs drawn today. He also wants to let me know that he went to the pain clinic that I referred him to for the arthritis in his back. They told him that he needs to go to PT. He states that he cannot afford to take any more time off of work for treatments with PT. He has done PT in the past for his back and it did not help. He would like a refill of his hydrocodone as well as his viagra.  Review of Systems       Past Medical History  Diagnosis Date  . Diabetes mellitus without complication   . Hypothyroidism     Current Outpatient Prescriptions  Medication Sig Dispense Refill  . acetaminophen-codeine (TYLENOL #3) 300-30 MG per tablet Take 1 tablet by mouth every 4 (four) hours as needed. 1-2 tablets      . glipiZIDE (GLUCOTROL XL) 10 MG 24 hr tablet Take 1 tablet (10 mg total) by mouth every morning.  30 tablet  2  . HYDROcodone-acetaminophen (NORCO) 10-325 MG per tablet Take 1 tablet by mouth every 8 (eight) hours as needed for pain.  90 tablet  0  . levothyroxine (SYNTHROID, LEVOTHROID) 112 MCG tablet Take 1 tablet (112 mcg total) by mouth daily.  90 tablet  3  . meloxicam (MOBIC) 7.5 MG tablet Take 2 tablets (15 mg total) by mouth daily.  30 tablet  1  . metFORMIN (GLUCOPHAGE) 1000 MG tablet Take 1 tablet (1,000 mg total) by mouth 2 (two) times daily with a meal.  60 tablet  2  . methylPREDNISolone (MEDROL, PAK,) 4 MG tablet follow package directions  21 tablet  0  . sildenafil (VIAGRA) 100 MG tablet Take 1 tablet (100 mg total) by mouth daily as needed for erectile dysfunction.  10 tablet  0  . traMADol (ULTRAM) 50 MG tablet Take 1 tablet (50 mg total) by mouth every 8  (eight) hours as needed for pain.  90 tablet  1   No current facility-administered medications for this visit.    No Known Allergies  Family History  Problem Relation Age of Onset  . Stroke Mother   . Heart disease Mother   . Kidney disease Mother   . Hypertension Mother   . Diabetes Mother   . Cancer Mother   . Alcohol abuse Father     History   Social History  . Marital Status: Single    Spouse Name: N/A    Number of Children: N/A  . Years of Education: 12   Occupational History  .     Social History Main Topics  . Smoking status: Former Research scientist (life sciences)  . Smokeless tobacco: Never Used  . Alcohol Use: No  . Drug Use: No  . Sexually Active: Yes   Other Topics Concern  . Not on file   Social History Narrative   Regular exercise-no   Caffeine Use-yes           Constitutional: Denies fever, malaise, fatigue, headache or abrupt weight changes. Marland Kitchen Respiratory: Denies difficulty breathing, shortness of breath, cough or sputum production.   Cardiovascular: Denies chest pain, chest tightness, palpitations  or swelling in the hands or feet.  Gastrointestinal: Denies abdominal pain, bloating, constipation, diarrhea or blood in the stool.  Skin: Denies redness, rashes, lesions or ulcercations.  MSK: Pt reports chronic back pain. Denies decrease in range of motion, difficulty with gait or muscle pain.Marland Kitchen Neurological: Denies dizziness, difficulty with memory, difficulty with speech or problems with balance and coordination.   No other specific complaints in a complete review of systems (except as listed in HPI above).   Objective:   Physical Exam  BP 136/82  Pulse 68  Temp(Src) 98 F (36.7 C) (Oral)  Wt 167 lb 12.8 oz (76.114 kg)  BMI 27.1 kg/m2  SpO2 98% Wt Readings from Last 3 Encounters:  03/22/13 167 lb 12.8 oz (76.114 kg)  02/26/13 172 lb 3.2 oz (78.109 kg)  02/05/13 173 lb 3.2 oz (78.563 kg)    General: Appears his stated age, well developed, well nourished in  NAD. Skin: Warm, dry and intact. No rashes, lesions or ulcerations noted. Cardiovascular: Normal rate and rhythm. S1,S2 noted.  No murmur, rubs or gallops noted. No JVD or BLE edema. No carotid bruits noted. Pulmonary/Chest: Normal effort and positive vesicular breath sounds. No respiratory distress. No wheezes, rales or ronchi noted.  Abdomen: Soft and nontender. Normal bowel sounds, no bruits noted. No distention or masses noted. Liver, spleen and kidneys non palpable. Musculoskeletal: Normal range of motion. No signs of joint swelling. No difficulty with gait.  Neurological: Alert and oriented. Cranial nerves II-XII intact. Coordination normal. +DTRs bilaterally.   BMET    Component Value Date/Time   NA 139 01/22/2013 0823   K 4.2 01/22/2013 0823   CL 102 01/22/2013 0823   CO2 28 01/22/2013 0823   GLUCOSE 217* 01/22/2013 0823   BUN 13 01/22/2013 0823   CREATININE 1.4 01/22/2013 0823   CALCIUM 9.5 01/22/2013 0823    Lipid Panel     Component Value Date/Time   CHOL 263* 12/27/2012 1125   TRIG 358.0* 12/27/2012 1125   HDL 56.10 12/27/2012 1125   CHOLHDL 5 12/27/2012 1125   VLDL 71.6* 12/27/2012 1125   LDLCALC 108* 12/01/2009 2007    CBC    Component Value Date/Time   WBC 4.2* 12/27/2012 1125   RBC 4.13* 12/27/2012 1125   HGB 12.7* 12/27/2012 1125   HCT 37.9* 12/27/2012 1125   PLT 158.0 12/27/2012 1125   MCV 91.9 12/27/2012 1125   MCHC 33.6 12/27/2012 1125   RDW 15.5* 12/27/2012 1125   LYMPHSABS 1.8 12/01/2009 2007   MONOABS 0.2 12/01/2009 2007   EOSABS 0.2 12/01/2009 2007   BASOSABS 0.0 12/01/2009 2007    Hgb A1C Lab Results  Component Value Date   HGBA1C 10.1* 12/27/2012         Assessment & Plan:

## 2013-03-22 NOTE — Assessment & Plan Note (Signed)
Bs range from 95-200 Continue to work on diet and exercise Will recheck A1C today Continue current therapy unless told otherwise

## 2013-03-22 NOTE — Assessment & Plan Note (Signed)
Will refill Viagra today

## 2013-03-22 NOTE — Telephone Encounter (Signed)
Pt notified of results and states he needs more refills on his Hydrocodone. Please advise

## 2013-03-22 NOTE — Patient Instructions (Signed)

## 2013-03-26 ENCOUNTER — Encounter: Payer: Self-pay | Admitting: Physical Medicine & Rehabilitation

## 2013-03-26 ENCOUNTER — Ambulatory Visit (HOSPITAL_BASED_OUTPATIENT_CLINIC_OR_DEPARTMENT_OTHER): Payer: 59 | Admitting: Physical Medicine & Rehabilitation

## 2013-03-26 VITALS — BP 132/84 | HR 71 | Resp 14 | Ht 66.0 in | Wt 168.8 lb

## 2013-03-26 DIAGNOSIS — M47817 Spondylosis without myelopathy or radiculopathy, lumbosacral region: Secondary | ICD-10-CM

## 2013-03-26 DIAGNOSIS — M5136 Other intervertebral disc degeneration, lumbar region: Secondary | ICD-10-CM

## 2013-03-26 DIAGNOSIS — G8929 Other chronic pain: Secondary | ICD-10-CM

## 2013-03-26 DIAGNOSIS — M545 Low back pain: Secondary | ICD-10-CM

## 2013-03-26 DIAGNOSIS — M5137 Other intervertebral disc degeneration, lumbosacral region: Secondary | ICD-10-CM

## 2013-03-26 MED ORDER — TRAMADOL HCL 50 MG PO TABS: 100.0000 mg | ORAL_TABLET | Freq: Three times a day (TID) | ORAL | Status: DC | PRN

## 2013-03-26 NOTE — Progress Notes (Signed)
Subjective:    Patient ID: Donald King, male    DOB: [birth year], 57 y.o.   MRN: [id number]  HPI Did not go to therapy secondary to his work schedule. He is asking to be put on either Percocet or OxyContin like somebody else at work. Gets some relief from 50 mg tramadol 3 times a day. Works in Biomedical engineer. Pain Inventory Average Pain 6 Pain Right Now 7 My pain is sharp  In the last 24 hours, has pain interfered with the following? General activity 5 Relation with others 4 Enjoyment of life 7 What TIME of day is your pain at its worst? morning Sleep (in general) Fair  Pain is worse with: bending Pain improves with: rest Relief from Meds: 3  Mobility walk without assistance ability to climb steps?  no  Function employed # of hrs/week . 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  no  Physicians involved in your care Any changes since last visit?  no   Family History  Problem Relation Age of Onset  . Stroke Mother   . Heart disease Mother   . Kidney disease Mother   . Hypertension Mother   . Diabetes Mother   . Cancer Mother   . Alcohol abuse Father    History   Social History  . Marital Status: Single    Spouse Name: N/A    Number of Children: N/A  . Years of Education: 12   Occupational History  .     Social History Main Topics  . Smoking status: Former Research scientist (life sciences)  . Smokeless tobacco: Never Used  . Alcohol Use: No  . Drug Use: No  . Sexually Active: Yes   Other Topics Concern  . None   Social History Narrative   Regular exercise-no   Caffeine Use-yes         Past Surgical History  Procedure Laterality Date  . Appendectomy  1966   Past Medical History  Diagnosis Date  . Diabetes mellitus without complication   . Hypothyroidism    BP 132/84  Pulse 71  Resp 14  Ht 5\' 6"  (1.676 m)  Wt 168 lb 12.8 oz (76.567 kg)  BMI 27.26 kg/m2  SpO2 100%     Review of Systems  Constitutional:  Positive for unexpected weight change.  Endocrine:       High blood sugar  All other systems reviewed and are negative.       Objective:   Physical Exam  Nursing note and vitals reviewed. Constitutional: He is oriented to person, place, and time. He appears well-developed and well-nourished.  HENT:  Head: Normocephalic and atraumatic.  Eyes: Conjunctivae and EOM are normal. Pupils are equal, round, and reactive to light.  Musculoskeletal:       Right hip: Normal.       Left hip: Normal.       Lumbar back: He exhibits tenderness. He exhibits normal range of motion, no deformity, no pain and no spasm.  Negative straight leg raising Pain with light palpation of the lumbar spine  Neurological: He is alert and oriented to person, place, and time. He has normal reflexes.  Psychiatric: He has a normal mood and affect.          Assessment & Plan:  1. Lumbar pain has evidence of spondylosis and some degenerative disc but not severe. Given his work, would avoid narcotic analgesics secondary to potential with decreased alertness. Increase tramadol to 100  mg every 8 hours States he cannot go to physical therapy. Supplied him with exercises that he should do a daily basis RTC 2 months PA visit to review exercises  We discussed lumbar injections, medial branch blocks. Patient will call to schedule if he decides he wants to try this

## 2013-03-26 NOTE — Patient Instructions (Addendum)
Back Exercises These exercises may help you when beginning to rehabilitate your injury. Your symptoms may resolve with or without further involvement from your physician, physical therapist or athletic trainer. While completing these exercises, remember:   Restoring tissue flexibility helps normal motion to return to the joints. This allows healthier, less painful movement and activity.  An effective stretch should be held for at least 30 seconds.  A stretch should never be painful. You should only feel a gentle lengthening or release in the stretched tissue. STRETCH  Extension, Prone on Elbows   Lie on your stomach on the floor, a bed will be too soft. Place your palms about shoulder width apart and at the height of your head.  Place your elbows under your shoulders. If this is too painful, stack pillows under your chest.  Allow your body to relax so that your hips drop lower and make contact more completely with the floor.  Hold this position for __________ seconds.  Slowly return to lying flat on the floor. Repeat __________ times. Complete this exercise __________ times per day.  RANGE OF MOTION  Extension, Prone Press Ups   Lie on your stomach on the floor, a bed will be too soft. Place your palms about shoulder width apart and at the height of your head.  Keeping your back as relaxed as possible, slowly straighten your elbows while keeping your hips on the floor. You may adjust the placement of your hands to maximize your comfort. As you gain motion, your hands will come more underneath your shoulders.  Hold this position __________ seconds.  Slowly return to lying flat on the floor. Repeat __________ times. Complete this exercise __________ times per day.  RANGE OF MOTION- Quadruped, Neutral Spine   Assume a hands and knees position on a firm surface. Keep your hands under your shoulders and your knees under your hips. You may place padding under your knees for comfort.  Drop  your head and point your tail bone toward the ground below you. This will round out your low back like an angry cat. Hold this position for __________ seconds.  Slowly lift your head and release your tail bone so that your back sags into a large arch, like an old horse.  Hold this position for __________ seconds.  Repeat this until you feel limber in your low back.  Now, find your "sweet spot." This will be the most comfortable position somewhere between the two previous positions. This is your neutral spine. Once you have found this position, tense your stomach muscles to support your low back.  Hold this position for __________ seconds. Repeat __________ times. Complete this exercise __________ times per day.  STRETCH  Flexion, Single Knee to Chest   Lie on a firm bed or floor with both legs extended in front of you.  Keeping one leg in contact with the floor, bring your opposite knee to your chest. Hold your leg in place by either grabbing behind your thigh or at your knee.  Pull until you feel a gentle stretch in your low back. Hold __________ seconds.  Slowly release your grasp and repeat the exercise with the opposite side. Repeat __________ times. Complete this exercise __________ times per day.  STRETCH - Hamstrings, Standing  Stand or sit and extend your right / left leg, placing your foot on a chair or foot stool  Keeping a slight arch in your low back and your hips straight forward.  Lead with your chest and   lean forward at the waist until you feel a gentle stretch in the back of your right / left knee or thigh. (When done correctly, this exercise requires leaning only a small distance.)  Hold this position for __________ seconds. Repeat __________ times. Complete this stretch __________ times per day. STRENGTHENING  Deep Abdominals, Pelvic Tilt   Lie on a firm bed or floor. Keeping your legs in front of you, bend your knees so they are both pointed toward the ceiling and  your feet are flat on the floor.  Tense your lower abdominal muscles to press your low back into the floor. This motion will rotate your pelvis so that your tail bone is scooping upwards rather than pointing at your feet or into the floor.  With a gentle tension and even breathing, hold this position for __________ seconds. Repeat __________ times. Complete this exercise __________ times per day.  STRENGTHENING  Abdominals, Crunches   Lie on a firm bed or floor. Keeping your legs in front of you, bend your knees so they are both pointed toward the ceiling and your feet are flat on the floor. Cross your arms over your chest.  Slightly tip your chin down without bending your neck.  Tense your abdominals and slowly lift your trunk high enough to just clear your shoulder blades. Lifting higher can put excessive stress on the low back and does not further strengthen your abdominal muscles.  Control your return to the starting position. Repeat __________ times. Complete this exercise __________ times per day.  STRENGTHENING  Quadruped, Opposite UE/LE Lift   Assume a hands and knees position on a firm surface. Keep your hands under your shoulders and your knees under your hips. You may place padding under your knees for comfort.  Find your neutral spine and gently tense your abdominal muscles so that you can maintain this position. Your shoulders and hips should form a rectangle that is parallel with the floor and is not twisted.  Keeping your trunk steady, lift your right hand no higher than your shoulder and then your left leg no higher than your hip. Make sure you are not holding your breath. Hold this position __________ seconds.  Continuing to keep your abdominal muscles tense and your back steady, slowly return to your starting position. Repeat with the opposite arm and leg. Repeat __________ times. Complete this exercise __________ times per day. Document Released: 12/02/2005 Document  Revised: 02/06/2012 Document Reviewed: 02/26/2009 [facility] Patient Information 2013 [place].  Please do these exercises We will avoid narcotic pain medicines because of the type of work to do. You can get injured because those medicines can reduce your alertness We have doubled her pain medicine called tramadol which is nonsedating

## 2013-03-31 ENCOUNTER — Other Ambulatory Visit: Payer: Self-pay | Admitting: Internal Medicine

## 2013-04-15 ENCOUNTER — Telehealth: Payer: Self-pay | Admitting: *Deleted

## 2013-04-15 DIAGNOSIS — M545 Low back pain: Secondary | ICD-10-CM

## 2013-04-15 DIAGNOSIS — M1990 Unspecified osteoarthritis, unspecified site: Secondary | ICD-10-CM

## 2013-04-15 NOTE — Telephone Encounter (Signed)
Too early. We will refill it Friday so that he can pick it up over the weekend

## 2013-04-15 NOTE — Telephone Encounter (Signed)
Pt informed

## 2013-04-15 NOTE — Telephone Encounter (Signed)
Pt requesting refill of Hydrocodone-APAP 10-325mg  last refilled 03/22/2013 #60 with 0 refill-please advise.

## 2013-04-19 MED ORDER — HYDROCODONE-ACETAMINOPHEN 10-325 MG PO TABS: 1.0000 | ORAL_TABLET | Freq: Three times a day (TID) | ORAL | Status: DC | PRN

## 2013-04-19 NOTE — Telephone Encounter (Signed)
Rx faxed to [place], pt informed via VM rx sent to pharmacy.

## 2013-04-19 NOTE — Telephone Encounter (Signed)
#   60, no refills

## 2013-04-19 NOTE — Telephone Encounter (Signed)
Please advise on quantity and # of refills.

## 2013-05-20 ENCOUNTER — Telehealth: Payer: Self-pay | Admitting: *Deleted

## 2013-05-20 DIAGNOSIS — M1990 Unspecified osteoarthritis, unspecified site: Secondary | ICD-10-CM

## 2013-05-20 DIAGNOSIS — M545 Low back pain: Secondary | ICD-10-CM

## 2013-05-20 MED ORDER — HYDROCODONE-ACETAMINOPHEN 10-325 MG PO TABS: 1.0000 | ORAL_TABLET | Freq: Three times a day (TID) | ORAL | Status: DC | PRN

## 2013-05-20 NOTE — Telephone Encounter (Signed)
Okay to refill per NP-rx faxed to [facility], pt informed via VM.

## 2013-05-20 NOTE — Telephone Encounter (Signed)
Pt requesting refill of Hydrocodone-APAP-last written 04/19/2013 #60 with 0 refills-please advise.

## 2013-05-20 NOTE — Telephone Encounter (Signed)
Patient called to say "don't forget to send rx for Hydrocodone to [facility]".

## 2013-06-02 ENCOUNTER — Other Ambulatory Visit: Payer: Self-pay | Admitting: Internal Medicine

## 2013-06-06 ENCOUNTER — Other Ambulatory Visit: Payer: Self-pay

## 2013-06-17 ENCOUNTER — Other Ambulatory Visit: Payer: Self-pay | Admitting: *Deleted

## 2013-06-17 DIAGNOSIS — M545 Low back pain: Secondary | ICD-10-CM

## 2013-06-17 DIAGNOSIS — M1990 Unspecified osteoarthritis, unspecified site: Secondary | ICD-10-CM

## 2013-06-17 MED ORDER — HYDROCODONE-ACETAMINOPHEN 10-325 MG PO TABS: 1.0000 | ORAL_TABLET | Freq: Three times a day (TID) | ORAL | Status: DC | PRN

## 2013-06-17 NOTE — Telephone Encounter (Signed)
Faxed script bck to [place]...lmb

## 2013-07-10 ENCOUNTER — Other Ambulatory Visit: Payer: Self-pay | Admitting: Internal Medicine

## 2013-07-15 ENCOUNTER — Other Ambulatory Visit: Payer: Self-pay | Admitting: *Deleted

## 2013-07-15 DIAGNOSIS — M1990 Unspecified osteoarthritis, unspecified site: Secondary | ICD-10-CM

## 2013-07-15 DIAGNOSIS — M545 Low back pain: Secondary | ICD-10-CM

## 2013-07-15 MED ORDER — HYDROCODONE-ACETAMINOPHEN 10-325 MG PO TABS: 1.0000 | ORAL_TABLET | Freq: Three times a day (TID) | ORAL | Status: DC | PRN

## 2013-08-06 ENCOUNTER — Other Ambulatory Visit: Payer: Self-pay | Admitting: Internal Medicine

## 2013-08-12 ENCOUNTER — Telehealth: Payer: Self-pay

## 2013-08-12 DIAGNOSIS — M545 Low back pain: Secondary | ICD-10-CM

## 2013-08-12 DIAGNOSIS — M1990 Unspecified osteoarthritis, unspecified site: Secondary | ICD-10-CM

## 2013-08-12 NOTE — Telephone Encounter (Signed)
Refill request for Norco...ds,cma

## 2013-08-12 NOTE — Telephone Encounter (Signed)
Ok to refill 

## 2013-08-13 ENCOUNTER — Other Ambulatory Visit: Payer: Self-pay

## 2013-08-13 MED ORDER — HYDROCODONE-ACETAMINOPHEN 10-325 MG PO TABS: 1.0000 | ORAL_TABLET | Freq: Three times a day (TID) | ORAL | Status: DC | PRN

## 2013-08-13 NOTE — Addendum Note (Signed)
Addended by: Earley Brooke on: 08/13/2013 08:16 AM   Modules accepted: Orders

## 2013-08-30 ENCOUNTER — Telehealth: Payer: Self-pay | Admitting: Internal Medicine

## 2013-08-30 ENCOUNTER — Telehealth: Payer: Self-pay | Admitting: *Deleted

## 2013-08-30 NOTE — Telephone Encounter (Signed)
Called pt no answer LMOM with [name] response...Donald King

## 2013-08-30 NOTE — Telephone Encounter (Signed)
And he's not due to til 10/16, so we wont fill it until then

## 2013-08-30 NOTE — Telephone Encounter (Signed)
Spoke with pt advised of [name]'s message.

## 2013-08-30 NOTE — Telephone Encounter (Signed)
Pt called requesting refill on Hydrocodone; requesting #90 so he can take it 3 times a day and not run out during the month.  Please advise

## 2013-08-30 NOTE — Telephone Encounter (Signed)
Pt request phone call from [facility] or the assistant. Please call pt back

## 2013-08-30 NOTE — Telephone Encounter (Signed)
I am not going to increase the quantity. He is not interested in working with orthopedics or physical therapy. All he wants is to treat with pain medication. Keep it a # 60 and change the directions to Q12H prn for pain. thx!

## 2013-09-09 ENCOUNTER — Telehealth: Payer: Self-pay | Admitting: *Deleted

## 2013-09-09 ENCOUNTER — Other Ambulatory Visit: Payer: Self-pay

## 2013-09-09 DIAGNOSIS — M545 Low back pain: Secondary | ICD-10-CM

## 2013-09-09 DIAGNOSIS — E119 Type 2 diabetes mellitus without complications: Secondary | ICD-10-CM

## 2013-09-09 DIAGNOSIS — M1990 Unspecified osteoarthritis, unspecified site: Secondary | ICD-10-CM

## 2013-09-09 MED ORDER — GLIPIZIDE ER 10 MG PO TB24: ORAL_TABLET | ORAL | Status: DC

## 2013-09-09 MED ORDER — HYDROCODONE-ACETAMINOPHEN 10-325 MG PO TABS: 1.0000 | ORAL_TABLET | Freq: Three times a day (TID) | ORAL | Status: DC | PRN

## 2013-09-09 NOTE — Telephone Encounter (Signed)
Spoke with pt advised Rx ready for pick up

## 2013-09-09 NOTE — Telephone Encounter (Signed)
Ok to refill on or after 09/12/2013

## 2013-09-09 NOTE — Telephone Encounter (Signed)
Pt called requesting Hydrocodone refill.  Please advise 

## 2013-09-09 NOTE — Telephone Encounter (Signed)
Refilled glipizide to [place]

## 2013-09-27 ENCOUNTER — Telehealth: Payer: Self-pay

## 2013-09-27 NOTE — Telephone Encounter (Signed)
Patient requesting refill on Glipizide, last OV was in 02/2013. Does not show a scheduled f/u visit. Is it okay to refill or should patient be contacted for an OV. Please advise.

## 2013-09-30 NOTE — Telephone Encounter (Signed)
Pt should have had a 6 month f/u. Give him a 30 day supply with no refills until he has an appt

## 2013-09-30 NOTE — Telephone Encounter (Signed)
Left message on VM for pt to call for OV

## 2013-10-02 ENCOUNTER — Other Ambulatory Visit (INDEPENDENT_AMBULATORY_CARE_PROVIDER_SITE_OTHER): Payer: 59

## 2013-10-02 ENCOUNTER — Other Ambulatory Visit: Payer: Self-pay | Admitting: Nurse Practitioner

## 2013-10-02 ENCOUNTER — Encounter: Payer: Self-pay | Admitting: Nurse Practitioner

## 2013-10-02 ENCOUNTER — Ambulatory Visit (INDEPENDENT_AMBULATORY_CARE_PROVIDER_SITE_OTHER): Payer: 59 | Admitting: Nurse Practitioner

## 2013-10-02 VITALS — BP 124/80 | HR 71 | Temp 98.0°F | Ht 65.35 in | Wt 164.2 lb

## 2013-10-02 DIAGNOSIS — E039 Hypothyroidism, unspecified: Secondary | ICD-10-CM

## 2013-10-02 DIAGNOSIS — M549 Dorsalgia, unspecified: Secondary | ICD-10-CM

## 2013-10-02 DIAGNOSIS — E538 Deficiency of other specified B group vitamins: Secondary | ICD-10-CM

## 2013-10-02 DIAGNOSIS — E119 Type 2 diabetes mellitus without complications: Secondary | ICD-10-CM

## 2013-10-02 DIAGNOSIS — Z23 Encounter for immunization: Secondary | ICD-10-CM

## 2013-10-02 DIAGNOSIS — M129 Arthropathy, unspecified: Secondary | ICD-10-CM

## 2013-10-02 DIAGNOSIS — M1990 Unspecified osteoarthritis, unspecified site: Secondary | ICD-10-CM

## 2013-10-02 DIAGNOSIS — N529 Male erectile dysfunction, unspecified: Secondary | ICD-10-CM

## 2013-10-02 LAB — RENAL FUNCTION PANEL
Albumin: 3.8 g/dL (ref 3.5–5.2)
Calcium: 9.7 mg/dL (ref 8.4–10.5)
Creatinine, Ser: 1.2 mg/dL (ref 0.4–1.5)
Glucose, Bld: 143 mg/dL — ABNORMAL HIGH (ref 70–99)
Phosphorus: 3.8 mg/dL (ref 2.3–4.6)
Potassium: 3.9 mEq/L (ref 3.5–5.1)
Sodium: 141 mEq/L (ref 135–145)

## 2013-10-02 LAB — TSH: TSH: 0.58 u[IU]/mL (ref 0.35–5.50)

## 2013-10-02 LAB — ALT: ALT: 19 U/L (ref 0–53)

## 2013-10-02 LAB — HEMOGLOBIN A1C: Hgb A1c MFr Bld: 7.2 % — ABNORMAL HIGH (ref 4.6–6.5)

## 2013-10-02 MED ORDER — LEVOTHYROXINE SODIUM 100 MCG PO TABS: 100.0000 ug | ORAL_TABLET | Freq: Every day | ORAL | Status: DC

## 2013-10-02 MED ORDER — GLIPIZIDE ER 10 MG PO TB24: ORAL_TABLET | ORAL | Status: DC

## 2013-10-02 MED ORDER — MELOXICAM 7.5 MG PO TABS: 7.5000 mg | ORAL_TABLET | Freq: Two times a day (BID) | ORAL | Status: DC

## 2013-10-02 MED ORDER — SILDENAFIL CITRATE 100 MG PO TABS: 100.0000 mg | ORAL_TABLET | Freq: Every day | ORAL | Status: DC | PRN

## 2013-10-02 MED ORDER — TRAMADOL HCL 50 MG PO TABS: ORAL_TABLET | ORAL | Status: DC

## 2013-10-02 MED ORDER — HYDROCODONE-ACETAMINOPHEN 10-325 MG PO TABS: 1.0000 | ORAL_TABLET | Freq: Two times a day (BID) | ORAL | Status: DC | PRN

## 2013-10-02 MED ORDER — METFORMIN HCL 1000 MG PO TABS: 1000.0000 mg | ORAL_TABLET | Freq: Two times a day (BID) | ORAL | Status: DC

## 2013-10-02 MED ORDER — ROSUVASTATIN CALCIUM 10 MG PO TABS: 10.0000 mg | ORAL_TABLET | Freq: Every day | ORAL | Status: DC

## 2013-10-02 NOTE — Patient Instructions (Signed)
For diabetes: start Crestor daily and 2Tablespoons flax seeds to reduce risk of heart disease. Continue metformin & glucotrol. Consider going to free diabetes classes at [facility] as diabetes is complex and affects many body systems. Start a B complex daily-liqiud form, 2 droppersful daily. Check feet daily to look for skin breakdown.  For back pain: start tramadol daily, increasing by 1Tablet every 3 days until you are taking 2Tabs in am & 2 tabs in evening. Use meloxicam 1Tab in am & pm to decrease inflammation in back & decrease pain. Do back stretches EVERY DAY.  Use hydrocodone no more than twice daily & less if able. It is a highly addictive drug and NOT in your best interest.  For sleep, expect to sleep about 7 hrs a night. If you wake in night, get out of bed & read or listen to music. DO NOT look at a screen. You may use melatonin 6mg  1 hour before you want to fall asleep.  Continue with healthy eating and set a goal to walk 30 minutes daily. Nice to meet you.    Insomnia Insomnia is frequent trouble falling and/or staying asleep. Insomnia can be a long term problem or a short term problem. Both are common. Insomnia can be a short term problem when the wakefulness is related to a certain stress or worry. Long term insomnia is often related to ongoing stress during waking hours and/or poor sleeping habits. Overtime, sleep deprivation itself can make the problem worse. Every little thing feels more severe because you are overtired and your ability to cope is decreased. CAUSES   Stress, anxiety, and depression.  Poor sleeping habits.  Distractions such as TV in the bedroom.  Naps close to bedtime.  Engaging in emotionally charged conversations before bed.  Technical reading before sleep.  Alcohol and other sedatives. They may make the problem worse. They can hurt normal sleep patterns and normal dream activity.  Stimulants such as caffeine for several hours prior to bedtime.  Pain  syndromes and shortness of breath can cause insomnia.  Exercise late at night.  Changing time zones may cause sleeping problems (jet lag). It is sometimes helpful to have someone observe your sleeping patterns. They should look for periods of not breathing during the night (sleep apnea). They should also look to see how long those periods last. If you live alone or observers are uncertain, you can also be observed at a sleep clinic where your sleep patterns will be professionally monitored. Sleep apnea requires a checkup and treatment. Give your caregivers your medical history. Give your caregivers observations your family has made about your sleep.  SYMPTOMS   Not feeling rested in the morning.  Anxiety and restlessness at bedtime.  Difficulty falling and staying asleep. TREATMENT   Your caregiver may prescribe treatment for an underlying medical disorders. Your caregiver can give advice or help if you are using alcohol or other drugs for self-medication. Treatment of underlying problems will usually eliminate insomnia problems.  Medications can be prescribed for short time use. They are generally not recommended for lengthy use.  Over-the-counter sleep medicines are not recommended for lengthy use. They can be habit forming.  You can promote easier sleeping by making lifestyle changes such as:  Using relaxation techniques that help with breathing and reduce muscle tension.  Exercising earlier in the day.  Changing your diet and the time of your last meal. No night time snacks.  Establish a regular time to go to bed.  Counseling  can help with stressful problems and worry.  Soothing music and white noise may be helpful if there are background noises you cannot remove.  Stop tedious detailed work at least one hour before bedtime. HOME CARE INSTRUCTIONS   Keep a diary. Inform your caregiver about your progress. This includes any medication side effects. See your caregiver  regularly. Take note of:  Times when you are asleep.  Times when you are awake during the night.  The quality of your sleep.  How you feel the next day. This information will help your caregiver care for you.  Get out of bed if you are still awake after 15 minutes. Read or do some quiet activity. Keep the lights down. Wait until you feel sleepy and go back to bed.  Keep regular sleeping and waking hours. Avoid naps.  Exercise regularly.  Avoid distractions at bedtime. Distractions include watching television or engaging in any intense or detailed activity like attempting to balance the household checkbook.  Develop a bedtime ritual. Keep a familiar routine of bathing, brushing your teeth, climbing into bed at the same time each night, listening to soothing music. Routines increase the success of falling to sleep faster.  Use relaxation techniques. This can be using breathing and muscle tension release routines. It can also include visualizing peaceful scenes. You can also help control troubling or intruding thoughts by keeping your mind occupied with boring or repetitive thoughts like the old concept of counting sheep. You can make it more creative like imagining planting one beautiful flower after another in your backyard garden.  During your day, work to eliminate stress. When this is not possible use some of the previous suggestions to help reduce the anxiety that accompanies stressful situations. MAKE SURE YOU:   Understand these instructions.  Will watch your condition.  Will get help right away if you are not doing well or get worse. Document Released: 11/11/2000 Document Revised: 02/06/2012 Document Reviewed: 12/12/2007 [facility] Patient Information 2014 [place].

## 2013-10-02 NOTE — Progress Notes (Signed)
Pre-visit discussion using our clinic review tool. No additional management support is needed unless otherwise documented below in the visit note.  

## 2013-10-02 NOTE — Progress Notes (Signed)
Subjective:     Donald King is an 57 y.o. male who presents for follow up of diabetes. Current symptoms include: none. Patient denies foot ulcerations, hyperglycemia, hypoglycemia , increased appetite, nausea, paresthesia of the feet, polydipsia, polyuria, visual disturbances, vomiting and weight loss. Evaluation to date has included: fasting blood sugar, fasting lipid panel, hemoglobin A1C and microalbuminuria. Home sugars: patient does not check sugars. Current treatments: metformin & glucotrol. Pt eats vegan diet & exercises regularly. Last dilated eye exam 2012. Pt continues to c/o ongoing back pain. He is not interested in spinal injections as discussed by pain mngmt. He is not doing daily back exercises. He is taking hydrocodone tid which completely relieves pain. He is not using meloxicam or tramadol. We discussed the dangers of using a saw & hammer while at work when he is taking hydrocodone. Pt states it does not alter his sense of awareness. Additionally he complains of waking in early am & cannot go back to sleep.He denies day-time sleepiness. He wants xanax filled. I discussed sleep hygiene & realistic expectations of sleep.   The following portions of the patient's history were reviewed and updated as appropriate: allergies, current medications, past medical history, past social history and problem list.  Review of Systems Constitutional: negative for night sweats and weight loss Eyes: negative for visual disturbance Respiratory: negative for cough, sputum and wheezing Cardiovascular: negative for chest pain, chest pressure/discomfort, irregular heart beat and lower extremity edema Gastrointestinal: negative for change in bowel habits, constipation and diarrhea Genitourinary:positive for reports h/o erectile dysfunction, negative for frequency, hematuria and hesitancy Integument/breast: negative Musculoskeletal:positive for back pain, negative for myalgias and stiff  joints Behavioral/Psych: positive for sleep disturbance, negative for decreased appetite, depression, excessive alcohol consumption, illegal drug usage and tobacco use Endocrine: negative for diabetic symptoms including blurry vision, increased fatigue, polydipsia, polyphagia, polyuria and poor wound healing and temperature intolerance    Objective:    BP 124/80  Pulse 71  Temp(Src) 98 F (36.7 C) (Oral)  Ht 5' 5.35" (1.66 m)  Wt 164 lb 4 oz (74.503 kg)  BMI 27.04 kg/m2  SpO2 99% General appearance: alert, cooperative, appears stated age and no distress Head: Normocephalic, without obvious abnormality, atraumatic Eyes: negative findings: lids and lashes normal, conjunctivae and sclerae normal, corneas clear and pupils equal, round, reactive to light and accomodation Ears: normal TM's and external ear canals both ears Throat: lips, mucosa, and tongue normal; teeth and gums normal Neck: no adenopathy, no carotid bruit, supple, symmetrical, trachea midline and thyroid not enlarged, symmetric, no tenderness/mass/nodules Lungs: clear to auscultation bilaterally Heart: regular rate and rhythm, S1, S2 normal, no murmur, click, rub or gallop Abdomen: soft, non-tender; bowel sounds normal; no masses,  no organomegaly Extremities: extremities normal, atraumatic, no cyanosis or edema and Full sensation using monfilament except in L 2nd toe-bottom of foot Pulses: 2+ and symmetric Lymph nodes: Cervical, supraclavicular, and axillary nodes normal.  Laboratory: No components found with this basename: A1C      Assessment:    Diabetes mellitus Type II, under good control.   Chronic back pain, prev xrays l-spine show spondylosis & disc narrowing. Pt taking hydrocodone tid & has no pain. Not doing back stretches. Insomnia-wakes early am-after already slept 6 H, trouble going back to sleep. Pt does not report day-time sleepiness. Plan:    Discussed general issues about diabetes pathophysiology and  management. Counseling at today's visit: focused on the need for regular aerobic exercise. Encouraged aerobic exercise. Discussed foot care. Reminded to  get yearly retinal exam. Started statin drug see medication orders. Labs: hemoglobin A1C and microalbuminuria.  Back: will not prescribe hydrocodone more than bid. Rec mobic bid, re-start tramadol. Encourage exercises & consider spinal injections as suggested by pain clinic. Insomnia: discussed sleep hygiene, use melatonin. Will not prescribe xanax for sleep. See pt instructions.

## 2013-10-03 LAB — DRUG SCREEN, URINE
Amphetamine Screen, Ur: NEGATIVE
Barbiturate Quant, Ur: NEGATIVE
Cocaine Metabolites: NEGATIVE
Marijuana Metabolite: NEGATIVE
Methadone: NEGATIVE
Opiates: NEGATIVE

## 2013-10-04 ENCOUNTER — Telehealth: Payer: Self-pay

## 2013-10-04 NOTE — Telephone Encounter (Signed)
Phone call from patient on the triage line requesting his test results.

## 2013-10-04 NOTE — Telephone Encounter (Signed)
Spoke with pt advised of Result notes.

## 2013-11-01 ENCOUNTER — Telehealth: Payer: Self-pay | Admitting: Internal Medicine

## 2013-11-01 ENCOUNTER — Telehealth: Payer: Self-pay | Admitting: *Deleted

## 2013-11-01 NOTE — Telephone Encounter (Signed)
Pt request referral for pain management. Please advise.

## 2013-11-01 NOTE — Telephone Encounter (Signed)
Left detailed message on pts VM advising of MDs message as requested by pt.

## 2013-11-01 NOTE — Telephone Encounter (Signed)
Pt called requesting Hydrocodone refill.  Pt aware he will need to pick up Rx at [facility] office if approved.  Please advise

## 2013-11-01 NOTE — Telephone Encounter (Signed)
I will not refill hydrocodone. Pt should take mobic twice daily and 2 tabs (100 mg) tramadol twice daily for pain. He needs to go back to pain management if he is insistent on continuing to use narcotics for back pain.

## 2013-11-03 NOTE — Telephone Encounter (Signed)
Pt has already seen pain management. He does not need another referral. He saw Dr Joan Mayans 03/26/13. Please assign this pt to a PCP at [facility] or have him contact [facility] Silversmith if he wishes to continue to see her at her new location. I will not be following this patient.

## 2013-11-05 ENCOUNTER — Telehealth: Payer: Self-pay | Admitting: Internal Medicine

## 2013-11-05 NOTE — Telephone Encounter (Signed)
Pt is requesting a refill on hydrocodone. He is also requesting to switch PCP to stay in the [place] office.

## 2013-11-05 NOTE — Telephone Encounter (Signed)
He is not due for his hydrocodone until 12/16. It is ok with me if he switches to another provider

## 2013-11-07 NOTE — Telephone Encounter (Signed)
I prefer no - my practice is closed. Also please let patient know that i do not prescribe chronic narcotics. We can review with Dr Ronnald Ramp how all [name]'s patients are being divided among all providers at [facility] who is starting 11/2013. thanks

## 2013-11-07 NOTE — Telephone Encounter (Signed)
Pt is requesting to switch to Dr. Asa Lente.  Will this be ok?

## 2013-11-08 ENCOUNTER — Other Ambulatory Visit: Payer: Self-pay

## 2013-11-08 ENCOUNTER — Encounter: Payer: Self-pay | Admitting: Internal Medicine

## 2013-11-08 DIAGNOSIS — M1990 Unspecified osteoarthritis, unspecified site: Secondary | ICD-10-CM

## 2013-11-08 DIAGNOSIS — M549 Dorsalgia, unspecified: Secondary | ICD-10-CM

## 2013-11-08 MED ORDER — HYDROCODONE-ACETAMINOPHEN 10-325 MG PO TABS: ORAL_TABLET | ORAL | Status: DC

## 2013-11-08 NOTE — Telephone Encounter (Signed)
Spoke with patient and advised rx ready for pick-up and it will be at the front desk.  

## 2013-11-08 NOTE — Telephone Encounter (Signed)
Pt left v/m requesting rx hydrocodone apap; pt wants to pick up next week.Please advise.

## 2013-11-11 ENCOUNTER — Encounter: Payer: Self-pay | Admitting: Internal Medicine

## 2013-11-11 ENCOUNTER — Ambulatory Visit (INDEPENDENT_AMBULATORY_CARE_PROVIDER_SITE_OTHER): Payer: 59 | Admitting: Internal Medicine

## 2013-11-11 VITALS — BP 126/78 | HR 78 | Temp 97.5°F | Ht 65.0 in | Wt 162.0 lb

## 2013-11-11 DIAGNOSIS — M1990 Unspecified osteoarthritis, unspecified site: Secondary | ICD-10-CM

## 2013-11-11 DIAGNOSIS — M129 Arthropathy, unspecified: Secondary | ICD-10-CM

## 2013-11-11 DIAGNOSIS — M549 Dorsalgia, unspecified: Secondary | ICD-10-CM

## 2013-11-11 MED ORDER — HYDROCODONE-ACETAMINOPHEN 10-325 MG PO TABS: 1.0000 | ORAL_TABLET | Freq: Three times a day (TID) | ORAL | Status: DC | PRN

## 2013-11-11 MED ORDER — HYDROCODONE-ACETAMINOPHEN 10-325 MG PO TABS: ORAL_TABLET | ORAL | Status: DC

## 2013-11-11 NOTE — Progress Notes (Signed)
Pre-visit discussion using our clinic review tool. No additional management support is needed unless otherwise documented below in the visit note.  

## 2013-11-11 NOTE — Patient Instructions (Signed)
Diets for Diabetes, Food Labeling Look at food labels to help you decide how much of a product you can eat. You will want to check the amount of total carbohydrate in a serving to see how the food fits into your meal plan. In the list of ingredients, the ingredient present in the largest amount by weight must be listed first, followed by the other ingredients in descending order. STANDARD OF IDENTITY Most products have a list of ingredients. However, foods that the Food and Drug Administration (FDA) has given a standard of identity do not need a list of ingredients. A standard of identity means that a food must contain certain ingredients if it is called a particular name. Examples are mayonnaise, peanut butter, ketchup, jelly, and cheese. LABELING TERMS There are many terms found on food labels. Some of these terms have specific definitions. Some terms are regulated by the FDA, and the FDA has clearly specified how they can be used. Others are not regulated or well-defined and can be misleading and confusing. SPECIFICALLY DEFINED TERMS Nutritive Sweetener.  A sweetener that contains calories,such as table sugar or honey. Nonnutritive Sweetener.  A sweetener with few or no calories,such as saccharin, aspartame, sucralose, and cyclamate. LABELING TERMS REGULATED BY THE FDA Free.  The product contains only a tiny or small amount of fat, cholesterol, sodium, sugar, or calories. For example, a "fat-free" product will contain less than 0.5 g of fat per serving. Low.  A food described as "low" in fat, saturated fat, cholesterol, sodium, or calories could be eaten fairly often without exceeding dietary guidelines. For example, "low in fat" means no more than 3 g of fat per serving. Lean.  "Lean" and "extra lean" are U.S. Department of Agriculture (USDA) terms for use on meat and poultry products. "Lean" means the product contains less than 10 g of fat, 4 g of saturated fat, and 95 mg of cholesterol  per serving. "Lean" is not as low in fat as a product labeled "low." Extra Lean.  "Extra lean" means the product contains less than 5 g of fat, 2 g of saturated fat, and 95 mg of cholesterol per serving. While "extra lean" has less fat than "lean," it is still higher in fat than a product labeled "low." Reduced, Less, Fewer.  A diet product that contains 25% less of a nutrient or calories than the regular version. For example, hot dogs might be labeled "25% less fat than our regular hot dogs." Light/Lite.  A diet product that contains  fewer calories or  the fat of the original. For example, "light in sodium" means a product with  the usual sodium. More.  One serving contains at least 10% more of the daily value of a vitamin, mineral, or fiber than usual. Good Source Of.  One serving contains 10% to 19% of the daily value for a particular vitamin, mineral, or fiber. Excellent Source Of.  One serving contains 20% or more of the daily value for a particular nutrient. Other terms used might be "high in" or "rich in." Enriched or Fortified.  The product contains added vitamins, minerals, or protein. Nutrition labeling must be used on enriched or fortified foods. Imitation.  The product has been altered so that it is lower in protein, vitamins, or minerals than the usual food,such as imitation peanut butter. Total Fat.  The number listed is the total of all fat found in a serving of the product. Under total fat, food labels must list saturated fat and   trans fat, which are associated with raising bad cholesterol and an increased risk of heart blood vessel disease. Saturated Fat.  Mainly fats from animal-based sources. Some examples are red meat, cheese, cream, whole milk, and coconut oil. Trans Fat.  Found in some fried snack foods, packaged foods, and fried restaurant foods. It is recommended you eat as close to 0 g of trans fat as possible, since it raises bad cholesterol and lowers  good cholesterol. Polyunsaturated and Monounsaturated Fats.  More healthful fats. These fats are from plant sources. Total Carbohydrate.  The number of carbohydrate grams in a serving of the product. Under total carbohydrate are listed the other carbohydrate sources, such as dietary fiber and sugars. Dietary Fiber.  A carbohydrate from plant sources. Sugars.  Sugars listed on the label contain all naturally occurring sugars as well as added sugars. LABELING TERMS NOT REGULATED BY THE FDA Sugarless.  Table sugar (sucrose) has not been added. However, the manufacturer may use another form of sugar in place of sucrose to sweeten the product. For example, sugar alcohols are used to sweeten foods. Sugar alcohols are a form of sugar but are not table sugar. If a product contains sugar alcohols in place of sucrose, it can still be labeled "sugarless." Low Salt, Salt-Free, Unsalted, No Salt, No Salt Added, Without Added Salt.  Food that is usually processed with salt has been made without salt. However, the food may contain sodium-containing additives, such as preservatives, leavening agents, or flavorings. Natural.  This term has no legal meaning. Organic.  Foods that are certified as organic have been inspected and approved by the USDA to ensure they are produced without pesticides, fertilizers containing synthetic ingredients, bioengineering, or ionizing radiation. Document Released: 11/17/2003 Document Revised: 02/06/2012 Document Reviewed: 06/04/2009 [facility] Patient Information 2014 [place], [place].

## 2013-11-11 NOTE — Progress Notes (Signed)
Subjective:    Patient ID: Donald King, male    DOB: [birth year], 57 y.o.   MRN: [id number]  HPI  Pt presents to the clinic today for medication refill. He is out of his Norco. He reports this helps his arthritis more than anything. He would like to talk about the tramadol. It is not very effective. He would like to be able to take the Norco 3 times daily if he could. He has been seen by orthopedics but was told there was nothing that can be done for his pain.  Review of Systems      Past Medical History  Diagnosis Date  . Diabetes mellitus without complication   . Hypothyroidism     Current Outpatient Prescriptions  Medication Sig Dispense Refill  . ALPRAZolam (XANAX) 0.5 MG tablet Take 0.5 mg by mouth at bedtime as needed for anxiety.      Marland Kitchen glipiZIDE (GLUCOTROL XL) 10 MG 24 hr tablet TAKE ONE TABLET BY MOUTH IN THE MORNING  30 tablet  2  . HYDROcodone-acetaminophen (NORCO) 10-325 MG per tablet Take 1 tablet by mouth 3 (three) times daily as needed. To be filled on or after 12.16.2014.  90 tablet  0  . levothyroxine (SYNTHROID, LEVOTHROID) 100 MCG tablet Take 1 tablet (100 mcg total) by mouth daily.  90 tablet  0  . meloxicam (MOBIC) 7.5 MG tablet Take 1 tablet (7.5 mg total) by mouth 2 (two) times daily.  60 tablet  2  . metFORMIN (GLUCOPHAGE) 1000 MG tablet Take 1 tablet (1,000 mg total) by mouth 2 (two) times daily with a meal.  60 tablet  2  . rosuvastatin (CRESTOR) 10 MG tablet Take 1 tablet (10 mg total) by mouth daily.  30 tablet  2  . sildenafil (VIAGRA) 100 MG tablet Take 1 tablet (100 mg total) by mouth daily as needed for erectile dysfunction.  10 tablet  0   No current facility-administered medications for this visit.    No Known Allergies  Family History  Problem Relation Age of Onset  . Stroke Mother   . Heart disease Mother   . Kidney disease Mother   . Hypertension Mother   . Diabetes Mother   . Cancer Mother   . Alcohol abuse Father     History    Social History  . Marital Status: Single    Spouse Name: N/A    Number of Children: N/A  . Years of Education: 12   Occupational History  .     Social History Main Topics  . Smoking status: Former Research scientist (life sciences)  . Smokeless tobacco: Never Used  . Alcohol Use: No  . Drug Use: No  . Sexual Activity: Yes   Other Topics Concern  . Not on file   Social History Narrative   Regular exercise-no   Caffeine Use-yes           Constitutional: Denies fever, malaise, fatigue, headache or abrupt weight changes.  Musculoskeletal: Pt reports multiple joint pain. Denies decrease in range of motion, difficulty with gait, muscle pain or joint swelling.    No other specific complaints in a complete review of systems (except as listed in HPI above).  Objective:   Physical Exam   BP 126/78  Pulse 78  Temp(Src) 97.5 F (36.4 C) (Oral)  Ht 5\' 5"  (1.651 m)  Wt 162 lb (73.483 kg)  BMI 26.96 kg/m2  SpO2 99% Wt Readings from Last 3 Encounters:  11/11/13 162 lb (73.483 kg)  10/02/13 164 lb 4 oz (74.503 kg)  03/26/13 168 lb 12.8 oz (76.567 kg)    General: Appears his stated age, well developed, well nourished in NAD. Cardiovascular: Normal rate and rhythm. S1,S2 noted.  No murmur, rubs or gallops noted. No JVD or BLE edema. No carotid bruits noted. Pulmonary/Chest: Normal effort and positive vesicular breath sounds. No respiratory distress. No wheezes, rales or ronchi noted.  Musculoskeletal: Normal range of motion. No signs of joint swelling. No difficulty with gait.    BMET    Component Value Date/Time   NA 141 10/02/2013 0930   K 3.9 10/02/2013 0930   CL 110 10/02/2013 0930   CO2 23 10/02/2013 0930   GLUCOSE 143* 10/02/2013 0930   BUN 8 10/02/2013 0930   CREATININE 1.2 10/02/2013 0930   CALCIUM 9.7 10/02/2013 0930    Lipid Panel     Component Value Date/Time   CHOL 165 03/22/2013 0817   TRIG 86.0 03/22/2013 0817   HDL 37.70* 03/22/2013 0817   CHOLHDL 4 03/22/2013 0817   VLDL 17.2  03/22/2013 0817   LDLCALC 110* 03/22/2013 0817    CBC    Component Value Date/Time   WBC 4.4* 03/22/2013 0817   RBC 4.31 03/22/2013 0817   HGB 13.1 03/22/2013 0817   HCT 39.3 03/22/2013 0817   PLT 172.0 03/22/2013 0817   MCV 91.3 03/22/2013 0817   MCHC 33.3 03/22/2013 0817   RDW 13.8 03/22/2013 0817   LYMPHSABS 1.8 12/01/2009 2007   MONOABS 0.2 12/01/2009 2007   EOSABS 0.2 12/01/2009 2007   BASOSABS 0.0 12/01/2009 2007    Hgb A1C Lab Results  Component Value Date   HGBA1C 7.2* 10/02/2013        Assessment & Plan:

## 2013-11-11 NOTE — Assessment & Plan Note (Signed)
Controlled on Norco Will allow him to take it TID, # 90, no early refills, will not increase quantity any further Consider adding glucosamine

## 2013-12-10 ENCOUNTER — Encounter: Payer: Self-pay | Admitting: Internal Medicine

## 2013-12-11 ENCOUNTER — Other Ambulatory Visit: Payer: Self-pay | Admitting: Internal Medicine

## 2013-12-11 ENCOUNTER — Encounter: Payer: Self-pay | Admitting: Internal Medicine

## 2013-12-11 ENCOUNTER — Other Ambulatory Visit: Payer: Self-pay | Admitting: Family Medicine

## 2013-12-11 DIAGNOSIS — M1990 Unspecified osteoarthritis, unspecified site: Secondary | ICD-10-CM

## 2013-12-11 DIAGNOSIS — M549 Dorsalgia, unspecified: Secondary | ICD-10-CM

## 2013-12-11 MED ORDER — HYDROCODONE-ACETAMINOPHEN 10-325 MG PO TABS: ORAL_TABLET | ORAL | Status: DC

## 2013-12-11 NOTE — Telephone Encounter (Signed)
Pt requesting refill on Hydrocodone.  Please call pt when ready to be picked up.  Last filled 11/08/13.

## 2013-12-11 NOTE — Telephone Encounter (Signed)
Rx left in front office for pick up and pt is aware  

## 2013-12-12 ENCOUNTER — Other Ambulatory Visit: Payer: Self-pay

## 2013-12-12 ENCOUNTER — Other Ambulatory Visit: Payer: Self-pay | Admitting: Internal Medicine

## 2013-12-12 DIAGNOSIS — M1990 Unspecified osteoarthritis, unspecified site: Secondary | ICD-10-CM

## 2013-12-12 DIAGNOSIS — M549 Dorsalgia, unspecified: Secondary | ICD-10-CM

## 2013-12-12 MED ORDER — HYDROCODONE-ACETAMINOPHEN 10-325 MG PO TABS: 1.0000 | ORAL_TABLET | Freq: Three times a day (TID) | ORAL | Status: DC | PRN

## 2013-12-12 NOTE — Telephone Encounter (Signed)
Pt called requesting another hydrocodone apap rx. [facility] would not accept the prescription done on 12/11/13 due to no instructions on how to take medication. Pt request cb ASAP because pt will need to come on his lunch hour today to pick up rx.Please advise.

## 2014-04-14 ENCOUNTER — Other Ambulatory Visit: Payer: Self-pay

## 2014-04-14 DIAGNOSIS — M549 Dorsalgia, unspecified: Secondary | ICD-10-CM

## 2014-04-14 DIAGNOSIS — M1990 Unspecified osteoarthritis, unspecified site: Secondary | ICD-10-CM

## 2014-04-14 MED ORDER — HYDROCODONE-ACETAMINOPHEN 10-325 MG PO TABS: 1.0000 | ORAL_TABLET | Freq: Three times a day (TID) | ORAL | Status: DC | PRN

## 2014-04-14 NOTE — Telephone Encounter (Signed)
Rx left in front office for pick up and pt is aware  

## 2014-04-14 NOTE — Telephone Encounter (Signed)
RX printed and placed in your box

## 2014-04-14 NOTE — Telephone Encounter (Signed)
Pt left message requesting rx hydrocodone apap. Call when ready for pick up.

## 2014-05-09 ENCOUNTER — Other Ambulatory Visit: Payer: Self-pay

## 2014-05-09 DIAGNOSIS — M549 Dorsalgia, unspecified: Secondary | ICD-10-CM

## 2014-05-09 DIAGNOSIS — M1990 Unspecified osteoarthritis, unspecified site: Secondary | ICD-10-CM

## 2014-05-09 MED ORDER — HYDROCODONE-ACETAMINOPHEN 10-325 MG PO TABS: 1.0000 | ORAL_TABLET | Freq: Three times a day (TID) | ORAL | Status: DC | PRN

## 2014-05-09 NOTE — Telephone Encounter (Signed)
Left detailed msg per HIPAA letting pt know his Rx in in front office for pick up

## 2014-05-09 NOTE — Telephone Encounter (Signed)
Pt left v/m requesting rx hydrocodone apap. Call when ready for pick up. Pt is going out of town and request to pick up rx today.Please advise.

## 2014-05-09 NOTE — Telephone Encounter (Signed)
Pt left v/m wanting status of rx. Left v/m hydrocodone apap rx is at front desk for pick up.

## 2014-05-12 ENCOUNTER — Encounter: Payer: Self-pay | Admitting: Internal Medicine

## 2014-05-15 ENCOUNTER — Encounter: Payer: Self-pay | Admitting: Internal Medicine

## 2014-05-15 ENCOUNTER — Ambulatory Visit: Payer: 59 | Admitting: Internal Medicine

## 2014-05-15 ENCOUNTER — Ambulatory Visit (INDEPENDENT_AMBULATORY_CARE_PROVIDER_SITE_OTHER): Payer: PRIVATE HEALTH INSURANCE | Admitting: Internal Medicine

## 2014-05-15 VITALS — BP 124/86 | HR 58 | Temp 97.5°F | Wt 162.0 lb

## 2014-05-15 DIAGNOSIS — G4700 Insomnia, unspecified: Secondary | ICD-10-CM

## 2014-05-15 DIAGNOSIS — E039 Hypothyroidism, unspecified: Secondary | ICD-10-CM

## 2014-05-15 DIAGNOSIS — I10 Essential (primary) hypertension: Secondary | ICD-10-CM

## 2014-05-15 DIAGNOSIS — E119 Type 2 diabetes mellitus without complications: Secondary | ICD-10-CM

## 2014-05-15 DIAGNOSIS — E785 Hyperlipidemia, unspecified: Secondary | ICD-10-CM

## 2014-05-15 DIAGNOSIS — M129 Arthropathy, unspecified: Secondary | ICD-10-CM

## 2014-05-15 DIAGNOSIS — N529 Male erectile dysfunction, unspecified: Secondary | ICD-10-CM

## 2014-05-15 DIAGNOSIS — F411 Generalized anxiety disorder: Secondary | ICD-10-CM

## 2014-05-15 DIAGNOSIS — M1990 Unspecified osteoarthritis, unspecified site: Secondary | ICD-10-CM

## 2014-05-15 LAB — TSH: TSH: 4.69 u[IU]/mL — ABNORMAL HIGH (ref 0.35–4.50)

## 2014-05-15 LAB — COMPREHENSIVE METABOLIC PANEL
ALK PHOS: 64 U/L (ref 39–117)
ALT: 27 U/L (ref 0–53)
AST: 28 U/L (ref 0–37)
Albumin: 4 g/dL (ref 3.5–5.2)
BUN: 12 mg/dL (ref 6–23)
CO2: 29 mEq/L (ref 19–32)
Calcium: 9.3 mg/dL (ref 8.4–10.5)
Chloride: 103 mEq/L (ref 96–112)
Creatinine, Ser: 1.2 mg/dL (ref 0.4–1.5)
GFR: 77.76 mL/min (ref >60.00)
GLUCOSE: 202 mg/dL — AB (ref 70–99)
POTASSIUM: 4.3 meq/L (ref 3.5–5.1)
SODIUM: 138 meq/L (ref 135–145)
TOTAL PROTEIN: 7.3 g/dL (ref 6.0–8.3)
Total Bilirubin: 0.8 mg/dL (ref 0.2–1.2)

## 2014-05-15 LAB — CBC
HCT: 37.7 % — ABNORMAL LOW (ref 39.0–52.0)
Hemoglobin: 12.5 g/dL — ABNORMAL LOW (ref 13.0–17.0)
MCHC: 33.3 g/dL (ref 30.0–36.0)
MCV: 91 fl (ref 78.0–100.0)
Platelets: 171 10*3/uL (ref 150.0–400.0)
RBC: 4.14 Mil/uL — AB (ref 4.22–5.81)
RDW: 14.9 % (ref 11.5–15.5)
WBC: 3.6 10*3/uL — ABNORMAL LOW (ref 4.0–10.5)

## 2014-05-15 LAB — LIPID PANEL
CHOL/HDL RATIO: 4
Cholesterol: 174 mg/dL (ref 0–200)
HDL: 43.1 mg/dL (ref >39.00)
LDL CALC: 118 mg/dL — AB (ref 0–99)
NonHDL: 130.9
TRIGLYCERIDES: 66 mg/dL (ref 0.0–149.0)
VLDL: 13.2 mg/dL (ref 0.0–40.0)

## 2014-05-15 LAB — HEMOGLOBIN A1C: Hgb A1c MFr Bld: 8.7 % — ABNORMAL HIGH (ref 4.6–6.5)

## 2014-05-15 MED ORDER — ALPRAZOLAM 0.5 MG PO TABS: 0.5000 mg | ORAL_TABLET | Freq: Every evening | ORAL | Status: DC | PRN

## 2014-05-15 NOTE — Assessment & Plan Note (Signed)
No issues on synthroid Will check TSH level today Will adjust medication if needed He is in need of refills today

## 2014-05-15 NOTE — Assessment & Plan Note (Signed)
RX for xanax given

## 2014-05-15 NOTE — Progress Notes (Signed)
Pre visit review using our clinic review tool, if applicable. No additional management support is needed unless otherwise documented below in the visit note. 

## 2014-05-15 NOTE — Progress Notes (Signed)
Subjective:    Patient ID: Donald King, male    DOB: Apr 13, 1956, 58 y.o.   MRN: [id number]  HPI  Pt presents to the clinic today for 6 month f/u chronic medical conditions.  DM2: Not currently checking his sugars. He is taking Metformin and Glipizide. He does report that he is eating more sweets than previously. He denies numbness or tingling in his hands or feet.  Arthritis: Still c/o pain on Norco. Feels better when he takes 2 at a time.  Hypothyroidism: no issues on synthroid.  Hyperlipidemia: He reports he did not know that he was supposed to be taking crestor. He has been non compliant with a low fat, low cholesterol diet.  ED: Request Viagra samples today.  Insomnia: Seems worse lately. Has not been taking anything. He does request refill of his xanax today.    Review of Systems  Past Medical History  Diagnosis Date  . Diabetes mellitus without complication   . Hypothyroidism     Current Outpatient Prescriptions  Medication Sig Dispense Refill  . ALPRAZolam (XANAX) 0.5 MG tablet Take 1 tablet (0.5 mg total) by mouth at bedtime as needed for anxiety.  30 tablet  0  . glipiZIDE (GLUCOTROL XL) 10 MG 24 hr tablet TAKE ONE TABLET BY MOUTH IN THE MORNING  30 tablet  2  . HYDROcodone-acetaminophen (NORCO) 10-325 MG per tablet Take 1 tablet by mouth every 8 (eight) hours as needed. To be filled on or after 6.16.2015.  90 tablet  0  . levothyroxine (SYNTHROID, LEVOTHROID) 100 MCG tablet Take 1 tablet (100 mcg total) by mouth daily.  90 tablet  0  . meloxicam (MOBIC) 7.5 MG tablet Take 1 tablet (7.5 mg total) by mouth 2 (two) times daily.  60 tablet  2  . metFORMIN (GLUCOPHAGE) 1000 MG tablet Take 1 tablet (1,000 mg total) by mouth 2 (two) times daily with a meal.  60 tablet  2  . rosuvastatin (CRESTOR) 10 MG tablet Take 1 tablet (10 mg total) by mouth daily.  30 tablet  2  . sildenafil (VIAGRA) 100 MG tablet Take 1 tablet (100 mg total) by mouth daily as needed for erectile  dysfunction.  10 tablet  0   No current facility-administered medications for this visit.    No Known Allergies  Family History  Problem Relation Age of Onset  . Stroke Mother   . Heart disease Mother   . Kidney disease Mother   . Hypertension Mother   . Diabetes Mother   . Cancer Mother   . Alcohol abuse Father     History   Social History  . Marital Status: Single    Spouse Name: N/A    Number of Children: N/A  . Years of Education: 12   Occupational History  .     Social History Main Topics  . Smoking status: Never Smoker   . Smokeless tobacco: Never Used  . Alcohol Use: No  . Drug Use: No  . Sexual Activity: Yes   Other Topics Concern  . Not on file   Social History Narrative   Regular exercise-no   Caffeine Use-yes           Constitutional: Denies fever, malaise, fatigue, headache or abrupt weight changes.  Respiratory: Denies difficulty breathing, shortness of breath, cough or sputum production.   Cardiovascular: Denies chest pain, chest tightness, palpitations or swelling in the hands or feet.  Gastrointestinal: Denies abdominal pain, bloating, constipation, diarrhea or blood in the  stool.  Musculoskeletal: Pt reports joint pain. Denies decrease in range of motion, difficulty with gait, muscle pain or joint pain.   No other specific complaints in a complete review of systems (except as listed in HPI above).     Objective:   Physical Exam   BP 124/86  Pulse 58  Temp(Src) 97.5 F (36.4 C) (Oral)  Wt 162 lb (73.483 kg)  SpO2 99% Wt Readings from Last 3 Encounters:  05/15/14 162 lb (73.483 kg)  11/11/13 162 lb (73.483 kg)  10/02/13 164 lb 4 oz (74.503 kg)    General: Appears his stated age, well developed, well nourished in NAD. Neck: Normal range of motion. Neck supple, trachea midline. No massses, lumps or thyromegaly present.  Cardiovascular: Normal rate and rhythm. S1,S2 noted.  No murmur, rubs or gallops noted. No JVD or BLE edema. No  carotid bruits noted. Pulmonary/Chest: Normal effort and positive vesicular breath sounds. No respiratory distress. No wheezes, rales or ronchi noted.  Abdomen: Soft and nontender. Normal bowel sounds, no bruits noted. No distention or masses noted. Liver, spleen and kidneys non palpable. Musculoskeletal: Normal range of motion. No signs of joint swelling. No difficulty with gait.    BMET    Component Value Date/Time   NA 141 10/02/2013 0930   K 3.9 10/02/2013 0930   CL 110 10/02/2013 0930   CO2 23 10/02/2013 0930   GLUCOSE 143* 10/02/2013 0930   BUN 8 10/02/2013 0930   CREATININE 1.2 10/02/2013 0930   CALCIUM 9.7 10/02/2013 0930    Lipid Panel     Component Value Date/Time   CHOL 165 03/22/2013 0817   TRIG 86.0 03/22/2013 0817   HDL 37.70* 03/22/2013 0817   CHOLHDL 4 03/22/2013 0817   VLDL 17.2 03/22/2013 0817   LDLCALC 110* 03/22/2013 0817    CBC    Component Value Date/Time   WBC 4.4* 03/22/2013 0817   RBC 4.31 03/22/2013 0817   HGB 13.1 03/22/2013 0817   HCT 39.3 03/22/2013 0817   PLT 172.0 03/22/2013 0817   MCV 91.3 03/22/2013 0817   MCHC 33.3 03/22/2013 0817   RDW 13.8 03/22/2013 0817   LYMPHSABS 1.8 12/01/2009 2007   MONOABS 0.2 12/01/2009 2007   EOSABS 0.2 12/01/2009 2007   BASOSABS 0.0 12/01/2009 2007    Hgb A1C Lab Results  Component Value Date   HGBA1C 7.2* 10/02/2013        Assessment & Plan:

## 2014-05-15 NOTE — Patient Instructions (Addendum)
Fat and Cholesterol Control Diet  Fat and cholesterol levels in your blood and organs are influenced by your diet. High levels of fat and cholesterol may lead to diseases of the heart, small and large blood vessels, gallbladder, liver, and pancreas.  CONTROLLING FAT AND CHOLESTEROL WITH DIET  Although exercise and lifestyle factors are important, your diet is key. That is because certain foods are known to raise cholesterol and others to lower it. The goal is to balance foods for their effect on cholesterol and more importantly, to replace saturated and trans fat with other types of fat, such as monounsaturated fat, polyunsaturated fat, and omega-3 fatty acids.  On average, a person should consume no more than 15 to 17 g of saturated fat daily. Saturated and trans fats are considered "bad" fats, and they will raise LDL cholesterol. Saturated fats are primarily found in animal products such as meats, butter, and cream. However, that does not mean you need to give up all your favorite foods. Today, there are good tasting, low-fat, low-cholesterol substitutes for most of the things you like to eat. Choose low-fat or nonfat alternatives. Choose round or loin cuts of red meat. These types of cuts are lowest in fat and cholesterol. Chicken (without the skin), fish, veal, and ground turkey breast are great choices. Eliminate fatty meats, such as hot dogs and salami. Even shellfish have little or no saturated fat. Have a 3 oz (85 g) portion when you eat lean meat, poultry, or fish.  Trans fats are also called "partially hydrogenated oils." They are oils that have been scientifically manipulated so that they are solid at room temperature resulting in a longer shelf life and improved taste and texture of foods in which they are added. Trans fats are found in stick margarine, some tub margarines, cookies, crackers, and baked goods.   When baking and cooking, oils are a great substitute for butter. The monounsaturated oils are  especially beneficial since it is believed they lower LDL and raise HDL. The oils you should avoid entirely are saturated tropical oils, such as coconut and palm.   Remember to eat a lot from food groups that are naturally free of saturated and trans fat, including fish, fruit, vegetables, beans, grains (barley, rice, couscous, bulgur wheat), and pasta (without cream sauces).   IDENTIFYING FOODS THAT LOWER FAT AND CHOLESTEROL   Soluble fiber may lower your cholesterol. This type of fiber is found in fruits such as apples, vegetables such as broccoli, potatoes, and carrots, legumes such as beans, peas, and lentils, and grains such as barley. Foods fortified with plant sterols (phytosterol) may also lower cholesterol. You should eat at least 2 g per day of these foods for a cholesterol lowering effect.   Read package labels to identify low-saturated fats, trans fat free, and low-fat foods at the supermarket. Select cheeses that have only 2 to 3 g saturated fat per ounce. Use a heart-healthy tub margarine that is free of trans fats or partially hydrogenated oil. When buying baked goods (cookies, crackers), avoid partially hydrogenated oils. Breads and muffins should be made from whole grains (whole-wheat or whole oat flour, instead of "flour" or "enriched flour"). Buy non-creamy canned soups with reduced salt and no added fats.   FOOD PREPARATION TECHNIQUES   Never deep-fry. If you must fry, either stir-fry, which uses very little fat, or use non-stick cooking sprays. When possible, broil, bake, or roast meats, and steam vegetables. Instead of putting butter or margarine on vegetables, use lemon   and herbs, applesauce, and cinnamon (for squash and sweet potatoes). Use nonfat yogurt, salsa, and low-fat dressings for salads.   LOW-SATURATED FAT / LOW-FAT FOOD SUBSTITUTES  Meats / Saturated Fat (g)  · Avoid: Steak, marbled (3 oz/85 g) / 11 g  · Choose: Steak, lean (3 oz/85 g) / 4 g  · Avoid: Hamburger (3 oz/85 g) / 7  g  · Choose: Hamburger, lean (3 oz/85 g) / 5 g  · Avoid: Ham (3 oz/85 g) / 6 g  · Choose: Ham, lean cut (3 oz/85 g) / 2.4 g  · Avoid: Chicken, with skin, dark meat (3 oz/85 g) / 4 g  · Choose: Chicken, skin removed, dark meat (3 oz/85 g) / 2 g  · Avoid: Chicken, with skin, light meat (3 oz/85 g) / 2.5 g  · Choose: Chicken, skin removed, light meat (3 oz/85 g) / 1 g  Dairy / Saturated Fat (g)  · Avoid: Whole milk (1 cup) / 5 g  · Choose: Low-fat milk, 2% (1 cup) / 3 g  · Choose: Low-fat milk, 1% (1 cup) / 1.5 g  · Choose: Skim milk (1 cup) / 0.3 g  · Avoid: Hard cheese (1 oz/28 g) / 6 g  · Choose: Skim milk cheese (1 oz/28 g) / 2 to 3 g  · Avoid: Cottage cheese, 4% fat (1 cup) / 6.5 g  · Choose: Low-fat cottage cheese, 1% fat (1 cup) / 1.5 g  · Avoid: Ice cream (1 cup) / 9 g  · Choose: Sherbet (1 cup) / 2.5 g  · Choose: Nonfat frozen yogurt (1 cup) / 0.3 g  · Choose: Frozen fruit bar / trace  · Avoid: Whipped cream (1 tbs) / 3.5 g  · Choose: Nondairy whipped topping (1 tbs) / 1 g  Condiments / Saturated Fat (g)  · Avoid: Mayonnaise (1 tbs) / 2 g  · Choose: Low-fat mayonnaise (1 tbs) / 1 g  · Avoid: Butter (1 tbs) / 7 g  · Choose: Extra light margarine (1 tbs) / 1 g  · Avoid: Coconut oil (1 tbs) / 11.8 g  · Choose: Olive oil (1 tbs) / 1.8 g  · Choose: Corn oil (1 tbs) / 1.7 g  · Choose: Safflower oil (1 tbs) / 1.2 g  · Choose: Sunflower oil (1 tbs) / 1.4 g  · Choose: Soybean oil (1 tbs) / 2.4 g  · Choose: Canola oil (1 tbs) / 1 g  Document Released: 11/14/2005 Document Revised: 03/11/2013 Document Reviewed: 05/05/2011  [employer]® Patient Information ©2015 [employer]. This information is not intended to replace advice given to you by your health care provider. Make sure you discuss any questions you have with your health care provider.

## 2014-05-15 NOTE — Assessment & Plan Note (Signed)
Uses viagra prn We have no samples today

## 2014-05-15 NOTE — Assessment & Plan Note (Signed)
Non compliant with diet Foot exam today Will repeat A1C today Based on level, will adjust medication if needed He does need refills today

## 2014-05-15 NOTE — Assessment & Plan Note (Signed)
Noncompliant with diet Not taking his medication Will repeat lipid profile today Will restart medication if needed

## 2014-05-15 NOTE — Assessment & Plan Note (Signed)
Advised him to take medication as prescribed Will not increase quantity  Offered referral to pain management, he declines

## 2014-05-19 ENCOUNTER — Other Ambulatory Visit: Payer: Self-pay | Admitting: Internal Medicine

## 2014-05-19 MED ORDER — LEVOTHYROXINE SODIUM 112 MCG PO TABS: 112.0000 ug | ORAL_TABLET | Freq: Every day | ORAL | Status: DC

## 2014-05-21 ENCOUNTER — Other Ambulatory Visit: Payer: Self-pay

## 2014-05-21 DIAGNOSIS — E119 Type 2 diabetes mellitus without complications: Secondary | ICD-10-CM

## 2014-05-21 MED ORDER — GLIPIZIDE ER 10 MG PO TB24: ORAL_TABLET | ORAL | Status: DC

## 2014-05-21 MED ORDER — METFORMIN HCL 1000 MG PO TABS: 1000.0000 mg | ORAL_TABLET | Freq: Two times a day (BID) | ORAL | Status: DC

## 2014-05-22 ENCOUNTER — Telehealth: Payer: Self-pay

## 2014-05-22 NOTE — Telephone Encounter (Signed)
He got a prescription 04/14/14 so 1 month later, if he was taking medication as prescribed, there should have been Hydrocodone in the UDS.

## 2014-05-22 NOTE — Telephone Encounter (Signed)
What would you like to convey to him as far as prescribing medication? Please advise

## 2014-05-22 NOTE — Telephone Encounter (Signed)
Pt is aware of UDS results and he states that from 2/15-5/15 he was incarcerated for probation program--there fore he could not take medication as well as he stated if you refer to the records he had not requested a refill on that medication--pt states the Tramadol was left over from an old Rx he still had

## 2014-05-22 NOTE — Telephone Encounter (Signed)
Nothing, we will continue to monitor his drugs screens every 3 months for now

## 2014-05-22 NOTE — Telephone Encounter (Signed)
Rollene Fare wants to know why there is Tramadol  in his UDS (as she does NOT prescribe this) and no Hydrocodone as she prescribes-- lmovm for pt to return my call

## 2014-06-06 ENCOUNTER — Encounter: Payer: Self-pay | Admitting: Internal Medicine

## 2014-06-10 ENCOUNTER — Other Ambulatory Visit: Payer: Self-pay

## 2014-06-10 DIAGNOSIS — M1990 Unspecified osteoarthritis, unspecified site: Secondary | ICD-10-CM

## 2014-06-10 MED ORDER — HYDROCODONE-ACETAMINOPHEN 10-325 MG PO TABS: 1.0000 | ORAL_TABLET | Freq: Three times a day (TID) | ORAL | Status: DC | PRN

## 2014-06-10 NOTE — Telephone Encounter (Signed)
Pt left v/m requesting rx hydrocodone apap. Call when ready for pick up.  

## 2014-06-10 NOTE — Telephone Encounter (Signed)
Left detailed msg on VM per HIPAA letting pt know Rx was in front office ready to be picked up

## 2014-06-10 NOTE — Telephone Encounter (Signed)
RX printed and signed 

## 2014-07-10 ENCOUNTER — Telehealth: Payer: Self-pay

## 2014-07-10 DIAGNOSIS — M1990 Unspecified osteoarthritis, unspecified site: Secondary | ICD-10-CM

## 2014-07-10 MED ORDER — HYDROCODONE-ACETAMINOPHEN 10-325 MG PO TABS: 1.0000 | ORAL_TABLET | Freq: Three times a day (TID) | ORAL | Status: DC | PRN

## 2014-07-10 NOTE — Telephone Encounter (Signed)
RX has been given to [employer] [facility]

## 2014-07-10 NOTE — Telephone Encounter (Signed)
Pt left v/m requesting status of hydrocodone apap. Pt is going out of town and wants to pick up rx today.Please advise.

## 2014-07-10 NOTE — Telephone Encounter (Signed)
Pt left v/m requesting rx hydrocodone apap. Call when ready for pick up. Pt cannot pick up rx on 07/13/14 and pt request to pick up rx today.

## 2014-07-11 NOTE — Telephone Encounter (Signed)
Rx left in front office for pick up and pt is aware  

## 2014-08-11 ENCOUNTER — Other Ambulatory Visit: Payer: Self-pay

## 2014-08-11 DIAGNOSIS — M1990 Unspecified osteoarthritis, unspecified site: Secondary | ICD-10-CM

## 2014-08-11 MED ORDER — HYDROCODONE-ACETAMINOPHEN 10-325 MG PO TABS: 1.0000 | ORAL_TABLET | Freq: Three times a day (TID) | ORAL | Status: DC | PRN

## 2014-08-11 NOTE — Telephone Encounter (Signed)
Pt left v/m requesting rx hydrocodone apap. Call when ready for pick up.  

## 2014-08-11 NOTE — Telephone Encounter (Signed)
RX printed and signed 

## 2014-08-12 ENCOUNTER — Encounter: Payer: Self-pay | Admitting: Internal Medicine

## 2014-08-12 ENCOUNTER — Encounter: Payer: Self-pay | Admitting: Radiology

## 2014-08-12 NOTE — Telephone Encounter (Signed)
Rx left in front office for pick up and pt is aware  

## 2014-08-19 ENCOUNTER — Ambulatory Visit: Payer: PRIVATE HEALTH INSURANCE | Admitting: Internal Medicine

## 2014-08-25 ENCOUNTER — Other Ambulatory Visit: Payer: Self-pay | Admitting: Internal Medicine

## 2014-08-25 ENCOUNTER — Encounter: Payer: Self-pay | Admitting: Internal Medicine

## 2014-08-25 ENCOUNTER — Ambulatory Visit (INDEPENDENT_AMBULATORY_CARE_PROVIDER_SITE_OTHER): Payer: PRIVATE HEALTH INSURANCE | Admitting: Internal Medicine

## 2014-08-25 VITALS — BP 116/68 | HR 71 | Temp 97.9°F | Wt 159.0 lb

## 2014-08-25 DIAGNOSIS — E119 Type 2 diabetes mellitus without complications: Secondary | ICD-10-CM

## 2014-08-25 DIAGNOSIS — M1990 Unspecified osteoarthritis, unspecified site: Secondary | ICD-10-CM

## 2014-08-25 DIAGNOSIS — M129 Arthropathy, unspecified: Secondary | ICD-10-CM

## 2014-08-25 DIAGNOSIS — E1165 Type 2 diabetes mellitus with hyperglycemia: Secondary | ICD-10-CM

## 2014-08-25 DIAGNOSIS — Z23 Encounter for immunization: Secondary | ICD-10-CM

## 2014-08-25 DIAGNOSIS — E785 Hyperlipidemia, unspecified: Secondary | ICD-10-CM

## 2014-08-25 DIAGNOSIS — E039 Hypothyroidism, unspecified: Secondary | ICD-10-CM

## 2014-08-25 LAB — TSH: TSH: 3.69 u[IU]/mL (ref 0.35–4.50)

## 2014-08-25 LAB — HEMOGLOBIN A1C: Hgb A1c MFr Bld: 9.7 % — ABNORMAL HIGH (ref 4.6–6.5)

## 2014-08-25 LAB — T4, FREE: Free T4: 0.81 ng/dL (ref 0.60–1.60)

## 2014-08-25 MED ORDER — LEVOTHYROXINE SODIUM 112 MCG PO TABS: 112.0000 ug | ORAL_TABLET | Freq: Every day | ORAL | Status: DC

## 2014-08-25 MED ORDER — LINAGLIPTIN 5 MG PO TABS: 5.0000 mg | ORAL_TABLET | Freq: Every day | ORAL | Status: DC

## 2014-08-25 MED ORDER — GLIPIZIDE ER 10 MG PO TB24: ORAL_TABLET | ORAL | Status: DC

## 2014-08-25 MED ORDER — METFORMIN HCL 1000 MG PO TABS
1000.0000 mg | ORAL_TABLET | Freq: Two times a day (BID) | ORAL | Status: DC
Start: 2014-08-25 — End: 2014-12-01

## 2014-08-25 NOTE — Patient Instructions (Addendum)

## 2014-08-25 NOTE — Assessment & Plan Note (Addendum)
Continues to be noncompliant Will check A1C today Flu shot today Encouraged him to get a diabetic eye exam (none in the last 2 years) Information given on a low carb diet Will refill glipizide and glucophage today Will add tradjenta if A1C > 8

## 2014-08-25 NOTE — Addendum Note (Signed)
Addended by: Lurlean Nanny on: 08/25/2014 04:49 PM   Modules accepted: Orders

## 2014-08-25 NOTE — Progress Notes (Signed)
Pre visit review using our clinic review tool, if applicable. No additional management support is needed unless otherwise documented below in the visit note. 

## 2014-08-25 NOTE — Assessment & Plan Note (Signed)
UDS negatvie for prescription drugs He admits to taking 5-6 times per day Advised him no more controlled substances will be given by me Referred to pain clinic

## 2014-08-25 NOTE — Assessment & Plan Note (Signed)
Well controlled off crestor

## 2014-08-25 NOTE — Assessment & Plan Note (Signed)
He has been out of his medication x 1 week Will check TSH and T4 today Will refill/adjust medication as needed

## 2014-08-25 NOTE — Addendum Note (Signed)
Addended by: Lurlean Nanny on: 08/25/2014 03:24 PM   Modules accepted: Orders

## 2014-08-25 NOTE — Progress Notes (Signed)
Subjective:    Patient ID: Donald King, male    DOB: Mar 04, 1956, 58 y.o.   MRN: [id number]  HPI  Pt presents to the clinic today for recheck of diabetes/thyroid. His last A1C was 8.7%. His last TSH was 4.69.  His synthroid was increased to 122 mcg daily. No changes were made to his diabetes regimen. He was advised to get back on his diet, cut out the carbs and sweets. He reports that he continues to eat candy bars daily. He does exercise.  Additionally, his drug screen reveals that there is no active metabolite of the drug in his system. He is picking up his RX for Norco every 30 days. He tells me that he is in so much pain he normally takes his Norco 5-6 times per day. He is currently trying to get disability.  Review of Systems      Past Medical History  Diagnosis Date  . Diabetes mellitus without complication   . Hypothyroidism     Current Outpatient Prescriptions  Medication Sig Dispense Refill  . ALPRAZolam (XANAX) 0.5 MG tablet Take 1 tablet (0.5 mg total) by mouth at bedtime as needed for anxiety.  30 tablet  0  . glipiZIDE (GLUCOTROL XL) 10 MG 24 hr tablet TAKE ONE TABLET BY MOUTH IN THE MORNING  30 tablet  2  . HYDROcodone-acetaminophen (NORCO) 10-325 MG per tablet Take 1 tablet by mouth every 8 (eight) hours as needed. To be filled on or after 9.16.2015.  90 tablet  0  . levothyroxine (SYNTHROID, LEVOTHROID) 112 MCG tablet Take 1 tablet (112 mcg total) by mouth daily.  30 tablet  2  . meloxicam (MOBIC) 7.5 MG tablet Take 1 tablet (7.5 mg total) by mouth 2 (two) times daily.  60 tablet  2  . metFORMIN (GLUCOPHAGE) 1000 MG tablet Take 1 tablet (1,000 mg total) by mouth 2 (two) times daily with a meal.  60 tablet  2  . rosuvastatin (CRESTOR) 10 MG tablet Take 1 tablet (10 mg total) by mouth daily.  30 tablet  2  . sildenafil (VIAGRA) 100 MG tablet Take 1 tablet (100 mg total) by mouth daily as needed for erectile dysfunction.  10 tablet  0   No current facility-administered  medications for this visit.    No Known Allergies  Family History  Problem Relation Age of Onset  . Stroke Mother   . Heart disease Mother   . Kidney disease Mother   . Hypertension Mother   . Diabetes Mother   . Cancer Mother   . Alcohol abuse Father     History   Social History  . Marital Status: Single    Spouse Name: N/A    Number of Children: N/A  . Years of Education: 12   Occupational History  .     Social History Main Topics  . Smoking status: Never Smoker   . Smokeless tobacco: Never Used  . Alcohol Use: No  . Drug Use: No  . Sexual Activity: Yes   Other Topics Concern  . Not on file   Social History Narrative   Regular exercise-no   Caffeine Use-yes           Constitutional: Denies fever, malaise, fatigue, headache or abrupt weight changes.  Respiratory: Denies difficulty breathing, shortness of breath, cough or sputum production.   Cardiovascular: Denies chest pain, chest tightness, palpitations or swelling in the hands or feet.  Musculoskeletal: Pt reports chronic pain. Denies difficulty with gait, muscle  pain or joint swelling.  Skin: Denies redness, rashes, lesions or ulcercations.    No other specific complaints in a complete review of systems (except as listed in HPI above).  Objective:   Physical Exam  BP 116/68  Pulse 71  Temp(Src) 97.9 F (36.6 C) (Oral)  Wt 159 lb (72.122 kg)  SpO2 98% Wt Readings from Last 3 Encounters:  08/25/14 159 lb (72.122 kg)  05/15/14 162 lb (73.483 kg)  11/11/13 162 lb (73.483 kg)    General: Appears his stated age, well developed, well nourished in NAD. Skin: Warm, dry and intact. No rashes, lesions or ulcerations noted. Cardiovascular: Normal rate and rhythm. S1,S2 noted.  No murmur, rubs or gallops noted. No JVD or BLE edema. No carotid bruits noted. Pulmonary/Chest: Normal effort and positive vesicular breath sounds. No respiratory distress. No wheezes, rales or ronchi noted.  Musculoskeletal:  Normal range of motion. No signs of joint swelling. No difficulty with gait.    BMET    Component Value Date/Time   NA 138 05/15/2014 1041   K 4.3 05/15/2014 1041   CL 103 05/15/2014 1041   CO2 29 05/15/2014 1041   GLUCOSE 202* 05/15/2014 1041   BUN 12 05/15/2014 1041   CREATININE 1.2 05/15/2014 1041   CALCIUM 9.3 05/15/2014 1041    Lipid Panel     Component Value Date/Time   CHOL 174 05/15/2014 1041   TRIG 66.0 05/15/2014 1041   HDL 43.10 05/15/2014 1041   CHOLHDL 4 05/15/2014 1041   VLDL 13.2 05/15/2014 1041   LDLCALC 118* 05/15/2014 1041    CBC    Component Value Date/Time   WBC 3.6* 05/15/2014 1041   RBC 4.14* 05/15/2014 1041   HGB 12.5* 05/15/2014 1041   HCT 37.7* 05/15/2014 1041   PLT 171.0 05/15/2014 1041   MCV 91.0 05/15/2014 1041   MCHC 33.3 05/15/2014 1041   RDW 14.9 05/15/2014 1041   LYMPHSABS 1.8 12/01/2009 2007   MONOABS 0.2 12/01/2009 2007   EOSABS 0.2 12/01/2009 2007   BASOSABS 0.0 12/01/2009 2007    Hgb A1C Lab Results  Component Value Date   HGBA1C 8.7* 05/15/2014         Assessment & Plan:

## 2014-08-29 ENCOUNTER — Encounter: Payer: Self-pay | Admitting: Internal Medicine

## 2014-09-10 ENCOUNTER — Other Ambulatory Visit: Payer: Self-pay

## 2014-09-10 DIAGNOSIS — M1990 Unspecified osteoarthritis, unspecified site: Secondary | ICD-10-CM

## 2014-09-10 NOTE — Telephone Encounter (Signed)
Pt failed drug screen. No more controlled substances. He know this- was discussed at last visit, he was referred to pain management.

## 2014-09-10 NOTE — Telephone Encounter (Signed)
Pt left v/m requesting rx hydrocodone apap. Call when ready for pick up.pt going out of town and request pick up rx on 09/10/14 or 09/11/14.

## 2014-09-11 NOTE — Telephone Encounter (Signed)
Pt is aware as he has been referred to pain mgmt

## 2014-09-29 ENCOUNTER — Telehealth: Payer: Self-pay

## 2014-09-29 NOTE — Telephone Encounter (Signed)
Will not restart hydrocodone, we can try another pain clinic or he can find another PCP

## 2014-09-29 NOTE — Telephone Encounter (Signed)
Pt left v/m; pt's ins will not cover pain clinic and pt request to be restarted on hydrocodone. Pt in a lot of pain and request cb.

## 2014-09-30 NOTE — Telephone Encounter (Signed)
Pt left v/m requesting cb about getting hydrocodone rx.

## 2014-10-03 NOTE — Telephone Encounter (Signed)
Pt is aware as instructed-- appt has been canceled as he was coming in for refill on controlled substance Rx

## 2014-10-06 ENCOUNTER — Ambulatory Visit: Payer: PRIVATE HEALTH INSURANCE | Admitting: Internal Medicine

## 2014-11-14 ENCOUNTER — Ambulatory Visit: Payer: PRIVATE HEALTH INSURANCE | Admitting: Internal Medicine

## 2014-11-17 ENCOUNTER — Ambulatory Visit: Payer: PRIVATE HEALTH INSURANCE | Admitting: Internal Medicine

## 2014-12-01 ENCOUNTER — Other Ambulatory Visit: Payer: Self-pay | Admitting: Internal Medicine

## 2014-12-03 ENCOUNTER — Other Ambulatory Visit: Payer: Self-pay | Admitting: Internal Medicine

## 2014-12-03 ENCOUNTER — Telehealth: Payer: Self-pay

## 2014-12-03 DIAGNOSIS — M1990 Unspecified osteoarthritis, unspecified site: Secondary | ICD-10-CM

## 2014-12-03 NOTE — Telephone Encounter (Signed)
Pt request referral to pain mgt; pt has insurance now with [facility] and ins will cover pain mgt. Was disconnected x 2 with pt ? Problem with pts phone. Please advise.

## 2014-12-03 NOTE — Telephone Encounter (Signed)
Referral placed.

## 2014-12-11 ENCOUNTER — Ambulatory Visit: Payer: Self-pay | Admitting: Internal Medicine

## 2014-12-16 ENCOUNTER — Ambulatory Visit (INDEPENDENT_AMBULATORY_CARE_PROVIDER_SITE_OTHER): Payer: 59 | Admitting: Internal Medicine

## 2014-12-16 ENCOUNTER — Encounter: Payer: Self-pay | Admitting: Internal Medicine

## 2014-12-16 VITALS — BP 118/82 | HR 76 | Temp 97.6°F | Wt 160.0 lb

## 2014-12-16 DIAGNOSIS — J069 Acute upper respiratory infection, unspecified: Secondary | ICD-10-CM

## 2014-12-16 DIAGNOSIS — E039 Hypothyroidism, unspecified: Secondary | ICD-10-CM

## 2014-12-16 DIAGNOSIS — M545 Low back pain: Secondary | ICD-10-CM

## 2014-12-16 DIAGNOSIS — N529 Male erectile dysfunction, unspecified: Secondary | ICD-10-CM

## 2014-12-16 DIAGNOSIS — E1165 Type 2 diabetes mellitus with hyperglycemia: Secondary | ICD-10-CM

## 2014-12-16 DIAGNOSIS — Z113 Encounter for screening for infections with a predominantly sexual mode of transmission: Secondary | ICD-10-CM

## 2014-12-16 DIAGNOSIS — E785 Hyperlipidemia, unspecified: Secondary | ICD-10-CM

## 2014-12-16 LAB — MICROALBUMIN / CREATININE URINE RATIO
Creatinine,U: 80.7 mg/dL
Microalb Creat Ratio: 1.4 mg/g (ref 0.0–30.0)
Microalb, Ur: 1.1 mg/dL (ref 0.0–1.9)

## 2014-12-16 LAB — LIPID PANEL
Cholesterol: 142 mg/dL (ref 0–200)
HDL: 39.7 mg/dL (ref >39.00)
LDL Cholesterol: 70 mg/dL (ref 0–99)
NonHDL: 102.3
TRIGLYCERIDES: 163 mg/dL — AB (ref 0.0–149.0)
Total CHOL/HDL Ratio: 4
VLDL: 32.6 mg/dL (ref 0.0–40.0)

## 2014-12-16 LAB — CBC
HCT: 37.9 % — ABNORMAL LOW (ref 39.0–52.0)
Hemoglobin: 12.6 g/dL — ABNORMAL LOW (ref 13.0–17.0)
MCHC: 33.2 g/dL (ref 30.0–36.0)
MCV: 90 fl (ref 78.0–100.0)
Platelets: 181 10*3/uL (ref 150.0–400.0)
RBC: 4.22 Mil/uL (ref 4.22–5.81)
RDW: 14.5 % (ref 11.5–15.5)
WBC: 6.7 10*3/uL (ref 4.0–10.5)

## 2014-12-16 LAB — COMPREHENSIVE METABOLIC PANEL
ALK PHOS: 69 U/L (ref 39–117)
ALT: 18 U/L (ref 0–53)
AST: 20 U/L (ref 0–37)
Albumin: 3.8 g/dL (ref 3.5–5.2)
BILIRUBIN TOTAL: 1 mg/dL (ref 0.2–1.2)
BUN: 6 mg/dL (ref 6–23)
CO2: 29 mEq/L (ref 19–32)
Calcium: 9.3 mg/dL (ref 8.4–10.5)
Chloride: 103 mEq/L (ref 96–112)
Creatinine, Ser: 1.24 mg/dL (ref 0.40–1.50)
GFR: 76.88 mL/min (ref >60.00)
GLUCOSE: 269 mg/dL — AB (ref 70–99)
POTASSIUM: 4 meq/L (ref 3.5–5.1)
Sodium: 138 mEq/L (ref 135–145)
TOTAL PROTEIN: 7.4 g/dL (ref 6.0–8.3)

## 2014-12-16 LAB — T4, FREE: Free T4: 0.93 ng/dL (ref 0.60–1.60)

## 2014-12-16 LAB — TSH: TSH: 0.61 u[IU]/mL (ref 0.35–4.50)

## 2014-12-16 LAB — HEMOGLOBIN A1C: Hgb A1c MFr Bld: 10.4 % — ABNORMAL HIGH (ref 4.6–6.5)

## 2014-12-16 MED ORDER — MELOXICAM 7.5 MG PO TABS: 7.5000 mg | ORAL_TABLET | Freq: Two times a day (BID) | ORAL | Status: DC

## 2014-12-16 MED ORDER — SILDENAFIL CITRATE 100 MG PO TABS: 100.0000 mg | ORAL_TABLET | Freq: Every day | ORAL | Status: DC | PRN

## 2014-12-16 MED ORDER — LINAGLIPTIN 5 MG PO TABS: 5.0000 mg | ORAL_TABLET | Freq: Every day | ORAL | Status: DC

## 2014-12-16 NOTE — Progress Notes (Signed)
Subjective:    Patient ID: Donald King, male    DOB: 11-24-1956, 59 y.o.   MRN: [id number]  HPI  Pt presents to the clinic today for 6 month follow up.  DM 2: His last A1C was 9.7% (07/2014). He is on Metformin and Glipizide. He was started on Tradjenta 07/2014 but reports he was not able to afford the medication, so he never picked it up. He never called to let us know that he was not able to afford it. Flu: 07/2014. Pneumovax: 2014 Vision Screening: as needed. Foot exam: yealry.  Hypothyroidism: Denies issues on his current dose of Synthroid. Last TSH and T4 WNL (9/2-15).  HLD: He was on Crestor in the past. Last LDL 118 (04/2015). He does report that he has been cheating on his diet.  ED: He request a refill of Viagra today.  Chronic back pain: No more narcotics from me secondary to failed drug screen x 2. He has been referred to pain management. He is waiting for an appt.  Additionally, he c/o watery eyes, runny nose and sore throat. He reports this started 2-3 days ago. He is blowing clear mucous out of his nose. He denies fever, chills or body aches. He has not tried anything OTC. He has not had sick contacts that he is aware of. He has no history of allergies or breathing problems.  Review of Systems  Past Medical History  Diagnosis Date  . Diabetes mellitus without complication   . Hypothyroidism     Current Outpatient Prescriptions  Medication Sig Dispense Refill  . glipiZIDE (GLUCOTROL XL) 10 MG 24 hr tablet TAKE ONE TABLET BY MOUTH IN THE MORNING 30 tablet 2  . levothyroxine (SYNTHROID, LEVOTHROID) 112 MCG tablet Take 1 tablet (112 mcg total) by mouth daily. 30 tablet 5  . meloxicam (MOBIC) 7.5 MG tablet Take 1 tablet (7.5 mg total) by mouth 2 (two) times daily. 60 tablet 2  . metFORMIN (GLUCOPHAGE) 1000 MG tablet TAKE ONE TABLET BY MOUTH TWICE DAILY WITH MEALS 60 tablet 2  . sildenafil (VIAGRA) 100 MG tablet Take 1 tablet (100 mg total) by mouth daily as needed for  erectile dysfunction. 10 tablet 0   No current facility-administered medications for this visit.    No Known Allergies  Family History  Problem Relation Age of Onset  . Stroke Mother   . Heart disease Mother   . Kidney disease Mother   . Hypertension Mother   . Diabetes Mother   . Cancer Mother   . Alcohol abuse Father     History   Social History  . Marital Status: Single    Spouse Name: N/A    Number of Children: N/A  . Years of Education: 12   Occupational History  .     Social History Main Topics  . Smoking status: Never Smoker   . Smokeless tobacco: Never Used  . Alcohol Use: No  . Drug Use: No  . Sexual Activity: Yes   Other Topics Concern  . Not on file   Social History Narrative   Regular exercise-no   Caffeine Use-yes           Constitutional: Denies fever, malaise, fatigue, headache or abrupt weight changes.  HEENT: Pt reports runny nose, sore throat. Denies eye pain, eye redness, ear pain, ringing in the ears, wax buildup, runny nose, nasal congestion, bloody nose. Respiratory: Denies difficulty breathing, shortness of breath, cough or sputum production.   Cardiovascular: Denies chest pain, chest  tightness, palpitations or swelling in the hands or feet.  Gastrointestinal: Denies abdominal pain, bloating, constipation, diarrhea or blood in the stool.  GU: Pt reports difficulty maintaining an erection. Denies urgency, frequency, pain with urination, burning sensation, blood in urine, odor or discharge. Musculoskeletal: Pt reports chronic back pain. Denies difficulty with gait, muscle pain or joint pain and swelling.  Skin: Denies redness, rashes, lesions or ulcercations.  Neurological: Denies dizziness, difficulty with memory, difficulty with speech or problems with balance and coordination.  Psych: Pt reports anxiety, depression, SI/HI. No other specific complaints in a complete review of systems (except as listed in HPI above).     Objective:    Physical Exam   BP 118/82 mmHg  Pulse 76  Temp(Src) 97.6 F (36.4 C) (Oral)  Wt 160 lb (72.576 kg)  SpO2 98% Wt Readings from Last 3 Encounters:  12/16/14 160 lb (72.576 kg)  08/25/14 159 lb (72.122 kg)  05/15/14 162 lb (73.483 kg)    General: Appears his stated age, well developed, well nourished in NAD. Skin: Warm, dry and intact. No rashes, lesions or ulcerations noted. HEENT: Head: normal shape and size; Eyes: sclera white, no icterus, conjunctiva pink; Ears: Tm's gray and intact, normal light reflex; Nose: mucosa pink and moist, septum midline; Throat/Mouth: Teeth present, mucosa pink and moist, no exudate, lesions or ulcerations noted.  Neck: No adenopathy noted.  Cardiovascular: Normal rate and rhythm. S1,S2 noted.  No murmur, rubs or gallops noted. No carotid bruits noted. Pulmonary/Chest: Normal effort and positive vesicular breath sounds. No respiratory distress. No wheezes, rales or ronchi noted.  Abdomen: Soft and nontender. Normal bowel sounds, no bruits noted. No distention or masses noted. Liver, spleen and kidneys non palpable. Musculoskeletal: Normal flexion, extension and rotation of the lumbar spine. Pain with palpation of the lumbar spine. Strength 5/5 BUE/BLE. No difficulty with gait.  Neurological: Alert and oriented. Sensation intact to BLE. Coordination normal.  Psychiatric: Mood and affect normal. Behavior is normal. Judgment and thought content normal.    BMET    Component Value Date/Time   NA 138 05/15/2014 1041   K 4.3 05/15/2014 1041   CL 103 05/15/2014 1041   CO2 29 05/15/2014 1041   GLUCOSE 202* 05/15/2014 1041   BUN 12 05/15/2014 1041   CREATININE 1.2 05/15/2014 1041   CALCIUM 9.3 05/15/2014 1041    Lipid Panel     Component Value Date/Time   CHOL 174 05/15/2014 1041   TRIG 66.0 05/15/2014 1041   HDL 43.10 05/15/2014 1041   CHOLHDL 4 05/15/2014 1041   VLDL 13.2 05/15/2014 1041   LDLCALC 118* 05/15/2014 1041    CBC    Component Value  Date/Time   WBC 3.6* 05/15/2014 1041   RBC 4.14* 05/15/2014 1041   HGB 12.5* 05/15/2014 1041   HCT 37.7* 05/15/2014 1041   PLT 171.0 05/15/2014 1041   MCV 91.0 05/15/2014 1041   MCHC 33.3 05/15/2014 1041   RDW 14.9 05/15/2014 1041   LYMPHSABS 1.8 12/01/2009 2007   MONOABS 0.2 12/01/2009 2007   EOSABS 0.2 12/01/2009 2007   BASOSABS 0.0 12/01/2009 2007    Hgb A1C Lab Results  Component Value Date   HGBA1C 9.7* 08/25/2014        Assessment & Plan:   Viral URI:  Symptomatic treatment at this time Ibuprofen and salt water gargles for sore throat Claritin daily for runny nose  RTC in 6 months for follow up of chronic conditions

## 2014-12-16 NOTE — Assessment & Plan Note (Signed)
No issues Will repeat TSH and T4 today Will adjust dose as needed

## 2014-12-16 NOTE — Addendum Note (Signed)
Addended by: Marchia Bond on: 12/16/2014 04:17 PM   Modules accepted: Miquel Dunn

## 2014-12-16 NOTE — Assessment & Plan Note (Signed)
Not well controlled Noncompliant with treatment regimen and diet Will repeat A1C and microalbumin today Foot exam today Flu and pnuemovax UTD Advised him to get a yearly eye exam Advised him to continue Metformin and Glipizide RX given for tradjenta with coupon- advised him to call me ASAP if still too expensive Handout given on diabetes and diet

## 2014-12-16 NOTE — Assessment & Plan Note (Signed)
Refill given of Viagra He request to be checked for HIV and syphillis

## 2014-12-16 NOTE — Patient Instructions (Signed)

## 2014-12-16 NOTE — Assessment & Plan Note (Signed)
LDL not at goal but he did not want to restart crestor Will repeat Lipid profile and CMET today Will restart crestor if LDL still not at goal < 100.

## 2014-12-16 NOTE — Progress Notes (Signed)
Pre visit review using our clinic review tool, if applicable. No additional management support is needed unless otherwise documented below in the visit note. 

## 2014-12-16 NOTE — Assessment & Plan Note (Signed)
Lower back Has failed 2 previous screens with me Meloxicam refilled today He has been referred to pain management- awaiting appt

## 2014-12-17 LAB — HIV ANTIBODY (ROUTINE TESTING W REFLEX): HIV 1&2 Ab, 4th Generation: NONREACTIVE

## 2014-12-17 LAB — RPR

## 2015-01-15 ENCOUNTER — Other Ambulatory Visit: Payer: Self-pay | Admitting: Internal Medicine

## 2015-01-15 ENCOUNTER — Telehealth: Payer: Self-pay | Admitting: Internal Medicine

## 2015-01-15 DIAGNOSIS — G894 Chronic pain syndrome: Secondary | ICD-10-CM

## 2015-01-15 NOTE — Telephone Encounter (Signed)
Please put New Pain referral in for this patient. [facility] for pain cancelled our referral when they determined that they would not see our patient. [facility] also said they declined due to his insurance. We will try another [facility] who accepts his insurance.

## 2015-01-15 NOTE — Telephone Encounter (Signed)
done

## 2015-02-02 ENCOUNTER — Telehealth: Payer: Self-pay | Admitting: Internal Medicine

## 2015-02-02 NOTE — Telephone Encounter (Signed)
Pt called checking on his pain management clinic

## 2015-02-12 ENCOUNTER — Other Ambulatory Visit: Payer: Self-pay | Admitting: Internal Medicine

## 2015-02-12 DIAGNOSIS — G894 Chronic pain syndrome: Secondary | ICD-10-CM

## 2015-02-16 ENCOUNTER — Telehealth: Payer: Self-pay | Admitting: Internal Medicine

## 2015-02-16 NOTE — Telephone Encounter (Signed)
[id number] cell  Pt returned your call about his pain management appointmetn

## 2015-02-21 ENCOUNTER — Other Ambulatory Visit: Payer: Self-pay | Admitting: Internal Medicine

## 2015-03-23 ENCOUNTER — Other Ambulatory Visit: Payer: Self-pay | Admitting: Internal Medicine

## 2015-04-11 ENCOUNTER — Emergency Department (HOSPITAL_COMMUNITY)
Admission: EM | Admit: 2015-04-11 | Discharge: 2015-04-11 | Discharge status: Against Medical Advice | Payer: 59 | Attending: Emergency Medicine | Admitting: Emergency Medicine

## 2015-04-11 ENCOUNTER — Encounter (HOSPITAL_COMMUNITY): Payer: Self-pay | Admitting: Emergency Medicine

## 2015-04-11 ENCOUNTER — Emergency Department (HOSPITAL_COMMUNITY): Payer: 59

## 2015-04-11 DIAGNOSIS — E039 Hypothyroidism, unspecified: Secondary | ICD-10-CM | POA: Diagnosis not present

## 2015-04-11 DIAGNOSIS — Z791 Long term (current) use of non-steroidal anti-inflammatories (NSAID): Secondary | ICD-10-CM | POA: Insufficient documentation

## 2015-04-11 DIAGNOSIS — E119 Type 2 diabetes mellitus without complications: Secondary | ICD-10-CM | POA: Insufficient documentation

## 2015-04-11 DIAGNOSIS — Z79899 Other long term (current) drug therapy: Secondary | ICD-10-CM | POA: Diagnosis not present

## 2015-04-11 DIAGNOSIS — F14129 Cocaine abuse with intoxication, unspecified: Secondary | ICD-10-CM | POA: Diagnosis not present

## 2015-04-11 DIAGNOSIS — Z008 Encounter for other general examination: Secondary | ICD-10-CM | POA: Diagnosis present

## 2015-04-11 DIAGNOSIS — IMO0002 Reserved for concepts with insufficient information to code with codable children: Secondary | ICD-10-CM

## 2015-04-11 LAB — ACETAMINOPHEN LEVEL: Acetaminophen (Tylenol), Serum: <10 ug/mL — ABNORMAL LOW (ref 10–30)

## 2015-04-11 LAB — COMPREHENSIVE METABOLIC PANEL
ALBUMIN: 3.4 g/dL — AB (ref 3.5–5.0)
ALT: 23 U/L (ref 17–63)
AST: 28 U/L (ref 15–41)
Alkaline Phosphatase: 66 U/L (ref 38–126)
Anion gap: 7 (ref 5–15)
BUN: 6 mg/dL (ref 6–20)
CALCIUM: 8.5 mg/dL — AB (ref 8.9–10.3)
CO2: 23 mmol/L (ref 22–32)
Chloride: 104 mmol/L (ref 101–111)
Creatinine, Ser: 1.41 mg/dL — ABNORMAL HIGH (ref 0.61–1.24)
GFR calc non Af Amer: 53 mL/min — ABNORMAL LOW (ref >60)
Glucose, Bld: 366 mg/dL — ABNORMAL HIGH (ref 65–99)
POTASSIUM: 4.1 mmol/L (ref 3.5–5.1)
SODIUM: 134 mmol/L — AB (ref 135–145)
TOTAL PROTEIN: 6.8 g/dL (ref 6.5–8.1)
Total Bilirubin: 0.8 mg/dL (ref 0.3–1.2)

## 2015-04-11 LAB — ETHANOL: Alcohol, Ethyl (B): <5 mg/dL (ref <5)

## 2015-04-11 LAB — CBC
HCT: 34.5 % — ABNORMAL LOW (ref 39.0–52.0)
HEMOGLOBIN: 11.6 g/dL — AB (ref 13.0–17.0)
MCH: 29.1 pg (ref 26.0–34.0)
MCHC: 33.6 g/dL (ref 30.0–36.0)
MCV: 86.5 fL (ref 78.0–100.0)
PLATELETS: 165 10*3/uL (ref 150–400)
RBC: 3.99 MIL/uL — AB (ref 4.22–5.81)
RDW: 13.2 % (ref 11.5–15.5)
WBC: 4.8 10*3/uL (ref 4.0–10.5)

## 2015-04-11 LAB — RAPID URINE DRUG SCREEN, HOSP PERFORMED
Amphetamines: NOT DETECTED
BARBITURATES: NOT DETECTED
Benzodiazepines: NOT DETECTED
Cocaine: NOT DETECTED
Opiates: NOT DETECTED
Tetrahydrocannabinol: NOT DETECTED

## 2015-04-11 LAB — SALICYLATE LEVEL: Salicylate Lvl: <4 mg/dL (ref 2.8–30.0)

## 2015-04-11 MED ORDER — CHARCOAL ACTIVATED PO LIQD
50.0000 g | Freq: Once | ORAL | Status: AC
  Administered 2015-04-11: 50 g via ORAL
  Filled 2015-04-11: qty 240

## 2015-04-11 NOTE — ED Notes (Signed)
Received pt via EMS with c/o pulled over by police and swallowed 2-3 grams of cocaine in plastic bag. Pt denies recent cocaine use, last use 20 years ago.

## 2015-04-11 NOTE — ED Provider Notes (Signed)
CSN: [id number]     Arrival date & time 04/11/15  1648 History   First MD Initiated Contact with Patient 04/11/15 1757     Chief Complaint  Patient presents with  . Drug Overdose   Jiaire Gane is a 59 y.o. male for the past medical history significant for hypothyroidism and diabetes who presents with cocaine pack ingestion. The patient reports that he was driving this afternoon when he was pulled over by the police department. The patient then reportedly swallowed a small sandwich bag with approximately 2-3 g of cocaine inside. The patient reports that "it was tied really tight". The patient denies any symptoms on arrival specifically denying any chest pain, shortness of breath, neurological abnormalities, diaphoresis, or any other symptoms. The patient says that he has ingested large amounts of cocaine in the past without complication. The patient says that he ingestion took place approximately 4 hours prior to arrival.   (Consider location/radiation/quality/duration/timing/severity/associated sxs/prior Treatment) Patient is a 59 y.o. male presenting with Ingested Medication. The history is provided by the patient. No language interpreter was used.  Ingestion This is a new problem. The current episode started today (4 hours ago). The problem occurs constantly. The problem has been unchanged. Pertinent negatives include no abdominal pain, anorexia, chest pain, chills, congestion, coughing, fatigue, fever, headaches, myalgias, nausea, neck pain, numbness, rash, vertigo, vomiting or weakness. Nothing aggravates the symptoms. He has tried nothing for the symptoms.    Past Medical History  Diagnosis Date  . Diabetes mellitus without complication   . Hypothyroidism    Past Surgical History  Procedure Laterality Date  . Appendectomy  1966   Family History  Problem Relation Age of Onset  . Stroke Mother   . Heart disease Mother   . Kidney disease Mother   . Hypertension Mother   . Diabetes  Mother   . Cancer Mother   . Alcohol abuse Father    History  Substance Use Topics  . Smoking status: Never Smoker   . Smokeless tobacco: Never Used  . Alcohol Use: No    Review of Systems  Constitutional: Negative for fever, chills, appetite change and fatigue.  HENT: Negative for congestion.   Eyes: Negative for visual disturbance.  Respiratory: Negative for cough, chest tightness, wheezing and stridor.   Cardiovascular: Negative for chest pain and palpitations.  Gastrointestinal: Negative for nausea, vomiting, abdominal pain, diarrhea, constipation, abdominal distention and anorexia.  Genitourinary: Negative for dysuria.  Musculoskeletal: Negative for myalgias, back pain, neck pain and neck stiffness.  Skin: Negative for rash and wound.  Neurological: Negative for dizziness, vertigo, weakness, numbness and headaches.  Psychiatric/Behavioral: Negative for behavioral problems and agitation.  All other systems reviewed and are negative.     Allergies  Review of patient's allergies indicates no known allergies.  Home Medications   Prior to Admission medications   Medication Sig Start Date End Date Taking? Authorizing Provider  glipiZIDE (GLUCOTROL XL) 10 MG 24 hr tablet TAKE ONE TABLET BY MOUTH ONCE DAILY IN THE MORNING 03/23/15  Yes Jearld Fenton, NP  levothyroxine (SYNTHROID, LEVOTHROID) 112 MCG tablet Take 1 tablet (112 mcg total) by mouth daily. 08/25/14  Yes Jearld Fenton, NP  linagliptin (TRADJENTA) 5 MG TABS tablet Take 1 tablet (5 mg total) by mouth daily. 12/16/14  Yes Jearld Fenton, NP  meloxicam (MOBIC) 7.5 MG tablet TAKE ONE TABLET BY MOUTH TWICE DAILY 03/23/15  Yes Jearld Fenton, NP  metFORMIN (GLUCOPHAGE) 1000 MG tablet TAKE ONE TABLET  BY MOUTH TWICE DAILY WITH  MEALS 02/21/15  Yes Jearld Fenton, NP  Oxycodone HCl 10 MG TABS Take 10 mg by mouth 2 (two) times daily as needed (pain).  03/26/15  Yes Historical Provider, MD  sildenafil (VIAGRA) 100 MG tablet Take 1  tablet (100 mg total) by mouth daily as needed for erectile dysfunction. 12/16/14  Yes Jearld Fenton, NP   BP 137/86 mmHg  Pulse 99  Temp(Src) 98.4 F (36.9 C) (Oral)  Resp 18  Ht 5\' 6"  (1.676 m)  Wt 160 lb (72.576 kg)  BMI 25.84 kg/m2  SpO2 98% Physical Exam  Constitutional: He is oriented to person, place, and time. He appears well-developed and well-nourished. No distress.  HENT:  Head: Normocephalic.  Mouth/Throat: No oropharyngeal exudate.  Eyes: Conjunctivae are normal. Pupils are equal, round, and reactive to light.  Neck: Normal range of motion.  Cardiovascular: Normal rate, regular rhythm, normal heart sounds and intact distal pulses.   No murmur heard. Pulmonary/Chest: Effort normal and breath sounds normal. No stridor. No respiratory distress. He has no wheezes. He exhibits no tenderness.  Abdominal: Soft. Bowel sounds are normal. He exhibits no distension. There is no tenderness. There is no rebound and no guarding.  Musculoskeletal: He exhibits no edema.  Neurological: He is alert and oriented to person, place, and time. He displays normal reflexes. No cranial nerve deficit. He exhibits normal muscle tone.  Skin: Skin is warm. He is not diaphoretic. No pallor.  Psychiatric: He has a normal mood and affect.  Nursing note and vitals reviewed.   ED Course  Procedures (including critical care time) Labs Review Labs Reviewed  ACETAMINOPHEN LEVEL - Abnormal; Notable for the following:    Acetaminophen (Tylenol), Serum <10 (*)    All other components within normal limits  CBC - Abnormal; Notable for the following:    RBC 3.99 (*)    Hemoglobin 11.6 (*)    HCT 34.5 (*)    All other components within normal limits  COMPREHENSIVE METABOLIC PANEL - Abnormal; Notable for the following:    Sodium 134 (*)    Glucose, Bld 366 (*)    Creatinine, Ser 1.41 (*)    Calcium 8.5 (*)    Albumin 3.4 (*)    GFR calc non Af Amer 53 (*)    All other components within normal limits   ETHANOL  SALICYLATE LEVEL  URINE RAPID DRUG SCREEN (HOSP PERFORMED)    Imaging Review Dg Abd Portable 1v  04/11/2015   CLINICAL DATA:  Drug overdose with cocaine 4 hours ago.  EXAM: PORTABLE ABDOMEN - 1 VIEW  COMPARISON:  02/05/2013  FINDINGS: Bowel gas pattern is nonobstructive with mild fecal retention throughout the colon. There are 2 cylindrical foreign bodies measuring 2.1 cm in length projected over the pelvis bilaterally. Minimal degenerative change of the spine and hips.  IMPRESSION: Nonobstructive bowel gas pattern.  Two small cylindrical symmetrical foreign bodies over the pelvis bilaterally of uncertain clinical significance.   Electronically Signed   By: Marin Olp M.D.   On: 04/11/2015 19:00     EKG Interpretation None      MDM   Final diagnoses:  Drug ingestion   Kastyn Steltenpohl is a 59 y.o. male for the past medical history significant for hypothyroidism and diabetes who presents with cocaine pack ingestion. The patient reports that a proximally 4 hours prior to arrival, the patient ingested approximately 2-3 g of cocaine tied within a sandwich bag.   The patient  is asymptomatic both prior to arrival and during initial evaluation.  The poison control was called and they recommended 6 hours of observation along with charcoal menstruation. The patient took the charcoal and a KUB was obtained. There is nonobstructive bowel gas pattern seen on the x-ray. The patient still had 2 hours of observation according to the recommendations by poison control.  The patient's EKG was unremarkable and his vital signs were unremarkable during his ED workup. The patient decided to sign out AMA despite the risks of the bag of cocaine opening  Being presented to him. The patient voiced understanding of return precautions and reports that he will follow-up with his PCP.  The patient signed the [facility] paperwork and left in stable condition.  This patient was seen with Dr. Kathrynn Humble, Emergency  Medicine Attending.      Antony Blackbird, MD 04/12/15 [id number]  Varney Biles, MD 04/12/15 1900

## 2015-04-11 NOTE — Discharge Instructions (Signed)
Activated Charcoal Activated charcoal is a mixture of very fine charcoal particles that absorb and trap poisons or drugs and prevent them from passing into your blood. Activated charcoal is usually given once. It may also be mixed with a medicine (laxative)to help you have a bowel movement. This helps move the toxins through the gut quickly. The activated charcoal may upset your stomach, cause vomiting, diarrhea or even constipation. Your stool (feces) will usually be black for 1 to 2 days. Call your caregiver if you have any concerns about your treatment. Document Released: 12/22/2004 Document Revised: 02/06/2012 Document Reviewed: 09/11/2009 [facility] Patient Information 2015 [place], [place]. This information is not intended to replace advice given to you by your health care provider. Make sure you discuss any questions you have with your health care provider.    Poisoning Information Poisoning is illness caused by eating, drinking, touching, or inhaling a harmful substance. The damaging effects on the person's health will vary depending on the type of poison, the amount of exposure, and the duration of exposure before treatment. These effects may range from mild to very severe or even fatal.  Most poisonings take place in the home and involve common household products. They can also occur in the workplace, especially in industrial or manufacturing facilities. Poisoning is more common in children than adults. However, poisoning often causes more serious illness in adults. Poisonings are often accidental, but there are also many cases in which a person intentionally ingests poison. WHAT THINGS MAY BE POISONOUS?  A poison can be any substance that causes illness or harm to the body. Poisoning is often caused by products that are commonly found in homes. Many substances can become poisonous if used in ways or amounts that are not appropriate. Some common products that can cause poisoning are:   Medicines,  including prescription medicines, over-the-counter pain medicines, vitamins, iron pills, and herbal supplements.  Cleaning or laundry products.  Paint and paint thinner.  Weed or insect killers.  Perfume, hair spray, or nail products.  Alcohol.  Plants, such as philodendron, poinsettia, oleander, castor bean, cactus, and tomato plants.  Batteries.  Furniture polish.  Drain cleaners.  Antifreeze or other automotive products.  Gasoline, lighter fluid, or lamp oil.  Carbon monoxide gas from furnaces or automobiles.  Toxic fumes from the burning of plastics or certain other materials. WHAT ARE SOME FIRST-AID MEASURES FOR POISONING? The local poison control center must be contacted whenever a person may have been exposed to poison. The poison control specialist will often give a set of directions to follow over the phone. These directions may include the following:  Remove any substance that is still in the mouth if the poison was not food or medicine. Drink a small amount of water.  Keep the medicine container if too much medicine or the wrong medicine was swallowed. Use it to identify the medicine to the poison control specialist.  Get away from the area where exposure occurred as soon as possible if the poison was from fumes or chemicals.  Get fresh air as soon as possible if a poison was inhaled.  Remove any affected clothing and rinse the skin with water if a poison got on the skin.  Rinse the eyes with water if a poison or chemical got in the eyes.  Begin cardiopulmonary resuscitation (CPR) if breathing stops. HOW CAN YOU PREVENT POISONING? Take these steps to help prevent poisoning:  Keep medicines and chemical products in their original containers. Many of these come in  child-safe packaging. Store them in areas out of reach of children.  Educate othersabout the dangers of possible poisons.  Read labels before using medicine or household products. Leave the  original labels on the containers.  Always turn on a light when taking medicine. Check the dosage every time.   Close the containers tightly after using medicine or chemical products.  Get rid of unneeded and outdated medicines by following the specific disposal instructions on the medicine label or the patient information that came with the medicine. Do not put medicine in the trash or flush it down the toilet. Use the community's drug take-back program to dispose of medicine. If these options are not available, take the medicine out of the original container and mix it with an undesirable substance, such as coffee grounds or kitty litter. Seal the mixture in a sealable bag, can, or other container and throw it away.  Keep all dangerous household products (such as lighter fluid, paint thinner and remover, gasoline, and antifreeze) in locked cabinets.  Do not mix different household chemicals with each other.  Use protective equipment (gloves, goggles, masks, aprons) as needed when using chemicals or cleaners.  Install a carbon monoxide detector in your home. WHEN SHOULD YOU SEEK HELP?  Contact the poison control center wheneveryou suspect that a person has been exposed to poison. Call [phone] (in the [place] to reach a poison center for your area. If you are outside the [place] ask your health care provider what the phone number is for your local poison control center. Keep the phone number posted near your phone. Make sure everyone in your household knows where to find the number. The local emergency services (911 in [place] must be contacted if a person has been exposed to poison and:   Has trouble breathing or stops breathing.  Develops chest pain.  Has trouble staying awake or becomes unconscious.  Has a seizure.  Has severe vomiting or bleeding.  Has a worsening headache.  Has a decreased level of alertness.  Develops a widespread rash that may or may not be painful.  Has  changes in vision.  Has difficulty swallowing.  Develops severe abdominal pain. FOR MORE INFORMATION  American Association of Poison Control Centers: www.aapcc.org Document Released: 10/31/2012 Document Revised: 03/31/2014 Document Reviewed: 10/31/2012 [facility] Patient Information 2015 [place], [place]. This information is not intended to replace advice given to you by your health care provider. Make sure you discuss any questions you have with your health care provider.

## 2015-05-24 ENCOUNTER — Other Ambulatory Visit: Payer: Self-pay | Admitting: Internal Medicine

## 2015-05-31 ENCOUNTER — Other Ambulatory Visit: Payer: Self-pay | Admitting: Internal Medicine

## 2015-06-18 ENCOUNTER — Ambulatory Visit: Payer: 59 | Admitting: Internal Medicine

## 2015-06-25 ENCOUNTER — Other Ambulatory Visit: Payer: Self-pay | Admitting: Internal Medicine

## 2015-07-08 ENCOUNTER — Other Ambulatory Visit: Payer: Self-pay | Admitting: Internal Medicine

## 2015-07-14 ENCOUNTER — Encounter: Payer: Self-pay | Admitting: Internal Medicine

## 2015-07-20 ENCOUNTER — Ambulatory Visit: Payer: 59 | Admitting: Internal Medicine

## 2015-08-16 ENCOUNTER — Other Ambulatory Visit: Payer: Self-pay | Admitting: Internal Medicine

## 2015-08-24 ENCOUNTER — Encounter: Payer: Self-pay | Admitting: Internal Medicine

## 2015-08-24 ENCOUNTER — Ambulatory Visit (INDEPENDENT_AMBULATORY_CARE_PROVIDER_SITE_OTHER): Payer: 59 | Admitting: Internal Medicine

## 2015-08-24 VITALS — BP 132/74 | HR 79 | Temp 98.0°F | Wt 168.0 lb

## 2015-08-24 DIAGNOSIS — E039 Hypothyroidism, unspecified: Secondary | ICD-10-CM

## 2015-08-24 DIAGNOSIS — Z125 Encounter for screening for malignant neoplasm of prostate: Secondary | ICD-10-CM | POA: Diagnosis not present

## 2015-08-24 DIAGNOSIS — Z23 Encounter for immunization: Secondary | ICD-10-CM

## 2015-08-24 DIAGNOSIS — Z113 Encounter for screening for infections with a predominantly sexual mode of transmission: Secondary | ICD-10-CM

## 2015-08-24 DIAGNOSIS — E785 Hyperlipidemia, unspecified: Secondary | ICD-10-CM | POA: Diagnosis not present

## 2015-08-24 DIAGNOSIS — N529 Male erectile dysfunction, unspecified: Secondary | ICD-10-CM

## 2015-08-24 DIAGNOSIS — E119 Type 2 diabetes mellitus without complications: Secondary | ICD-10-CM | POA: Diagnosis not present

## 2015-08-24 DIAGNOSIS — M1990 Unspecified osteoarthritis, unspecified site: Secondary | ICD-10-CM

## 2015-08-24 LAB — RPR

## 2015-08-24 MED ORDER — SILDENAFIL CITRATE 20 MG PO TABS: ORAL_TABLET | ORAL | Status: DC

## 2015-08-24 NOTE — Progress Notes (Signed)
Subjective:    Patient ID: Donald King, male    DOB: 05/01/1956, 59 y.o.   MRN: [id number]  HPI  Pt presents to the clinic today for 6 month follow up of chronic conditions.  DM 2: His last A1C was 10.4% (11/2014). His sugars range from 125-237. He reports he has been eating more sweets than usual. He is on Metformin, Glipizide and Tradjenta. Flu: 07/2015. Pneumovax: 2014. Vision Screening: as needed (> 2 years ago). Foot exam: yealry.   Hypothyroidism: Denies issues on his current dose of Synthroid. Last TSH and T4 WNL (11/2014).  HLD: He was on Crestor in the past. Last LDL 2016 (11/2014) off meds. He does report that he has been cheating on his diet.  ED: He reports the Viagra works well to help him maintain an erection but he can not get it often because it is so expensive. He request a refill of Viagra today.  Chronic back pain: No more narcotics from me secondary to failed drug screen x 2. He has been referred to pain management, but he has not gone to see them. He is working on getting disability with social services.    Review of Systems  Past Medical History  Diagnosis Date  . Diabetes mellitus without complication   . Hypothyroidism     Current Outpatient Prescriptions  Medication Sig Dispense Refill  . glipiZIDE (GLUCOTROL XL) 10 MG 24 hr tablet TAKE ONE TABLET BY MOUTH IN THE MORNING 30 tablet 1  . levothyroxine (SYNTHROID, LEVOTHROID) 112 MCG tablet TAKE ONE TABLET BY MOUTH ONCE DAILY 30 tablet 1  . linagliptin (TRADJENTA) 5 MG TABS tablet Take 1 tablet (5 mg total) by mouth daily. 30 tablet 1  . meloxicam (MOBIC) 7.5 MG tablet TAKE ONE TABLET BY MOUTH TWICE DAILY 60 tablet 2  . metFORMIN (GLUCOPHAGE) 1000 MG tablet TAKE ONE TABLET BY MOUTH TWICE DAILY WITH MEALS 60 tablet 1  . Oxycodone HCl 10 MG TABS Take 10 mg by mouth 2 (two) times daily as needed (pain).     . sildenafil (VIAGRA) 100 MG tablet Take 1 tablet (100 mg total) by mouth daily as needed for erectile  dysfunction. 10 tablet 0   No current facility-administered medications for this visit.    No Known Allergies  Family History  Problem Relation Age of Onset  . Stroke Mother   . Heart disease Mother   . Kidney disease Mother   . Hypertension Mother   . Diabetes Mother   . Cancer Mother   . Alcohol abuse Father     Social History   Social History  . Marital Status: Single    Spouse Name: N/A  . Number of Children: N/A  . Years of Education: 12   Occupational History  .     Social History Main Topics  . Smoking status: Never Smoker   . Smokeless tobacco: Never Used  . Alcohol Use: No  . Drug Use: No     Comment: cocaine 20 years ago (since 2016)  . Sexual Activity: Yes   Other Topics Concern  . Not on file   Social History Narrative   Regular exercise-no   Caffeine Use-yes           Constitutional: Denies fever, malaise, fatigue, headache or abrupt weight changes.  HEENT:  Denies eye pain, eye redness, ear pain, ringing in the ears, wax buildup, runny nose, nasal congestion, bloody nose. Respiratory: Denies difficulty breathing, shortness of breath, cough or sputum production.  Cardiovascular: Denies chest pain, chest tightness, palpitations or swelling in the hands or feet.  Gastrointestinal: Denies abdominal pain, bloating, constipation, diarrhea or blood in the stool.  GU: Pt reports difficulty maintaining an erection. Denies urgency, frequency, pain with urination, burning sensation, blood in urine, odor or discharge. Musculoskeletal: Pt reports chronic back pain. Denies difficulty with gait, muscle pain or joint pain and swelling.  Skin: Denies redness, rashes, lesions or ulcercations.  Neurological: Denies dizziness, difficulty with memory, difficulty with speech or problems with balance and coordination.  Psych: Pt denies anxiety, depression, SI/HI.  No other specific complaints in a complete review of systems (except as listed in HPI above).       Objective:   Physical Exam   BP 132/74 mmHg  Pulse 79  Temp(Src) 98 F (36.7 C) (Oral)  Wt 168 lb (76.204 kg)  SpO2 98% Wt Readings from Last 3 Encounters:  08/24/15 168 lb (76.204 kg)  04/11/15 160 lb (72.576 kg)  12/16/14 160 lb (72.576 kg)    General: Appears his stated age, well developed, well nourished in NAD. Skin: Warm, dry and intact. No rashes, lesions or ulcerations noted.  Neck: No masses, lumps noted. Mild thyromegaly. Cardiovascular: Normal rate and rhythm. S1,S2 noted.  No murmur, rubs or gallops noted. No carotid bruits noted. Pulmonary/Chest: Normal effort and positive vesicular breath sounds. No respiratory distress. No wheezes, rales or ronchi noted.  Musculoskeletal: Normal flexion, extension and rotation of the lumbar spine. Pain with palpation of the lumbar spine. Strength 5/5 BUE/BLE. No difficulty with gait.  Neurological: Alert and oriented. Sensation intact to BLE. Coordination normal.  Psychiatric: Mood and affect normal. Behavior is normal. Judgment and thought content normal.    BMET    Component Value Date/Time   NA 134* 04/11/2015 1700   K 4.1 04/11/2015 1700   CL 104 04/11/2015 1700   CO2 23 04/11/2015 1700   GLUCOSE 366* 04/11/2015 1700   BUN 6 04/11/2015 1700   CREATININE 1.41* 04/11/2015 1700   CALCIUM 8.5* 04/11/2015 1700   GFRNONAA 53* 04/11/2015 1700   GFRAA >60 04/11/2015 1700    Lipid Panel     Component Value Date/Time   CHOL 142 12/16/2014 0926   TRIG 163.0* 12/16/2014 0926   HDL 39.70 12/16/2014 0926   CHOLHDL 4 12/16/2014 0926   VLDL 32.6 12/16/2014 0926   LDLCALC 70 12/16/2014 0926    CBC    Component Value Date/Time   WBC 4.8 04/11/2015 1700   RBC 3.99* 04/11/2015 1700   HGB 11.6* 04/11/2015 1700   HCT 34.5* 04/11/2015 1700   PLT 165 04/11/2015 1700   MCV 86.5 04/11/2015 1700   MCH 29.1 04/11/2015 1700   MCHC 33.6 04/11/2015 1700   RDW 13.2 04/11/2015 1700   LYMPHSABS 1.8 12/01/2009 2007   MONOABS 0.2  12/01/2009 2007   EOSABS 0.2 12/01/2009 2007   BASOSABS 0.0 12/01/2009 2007    Hgb A1C Lab Results  Component Value Date   HGBA1C 10.4* 12/16/2014        Assessment & Plan:  Screening for Prostate Cancer:  PSA today  Screening for STD:  HIV and Syphilis today  RTC in 6 months for follow up of chronic conditions//annual exam

## 2015-08-24 NOTE — Progress Notes (Signed)
Pre visit review using our clinic review tool, if applicable. No additional management support is needed unless otherwise documented below in the visit note. 

## 2015-08-24 NOTE — Patient Instructions (Signed)
Arthritis, Nonspecific °Arthritis is inflammation of a joint. This usually means pain, redness, warmth or swelling are present. One or more joints may be involved. There are a number of types of arthritis. Your caregiver may not be able to tell what type of arthritis you have right away. °CAUSES  °The most common cause of arthritis is the wear and tear on the joint (osteoarthritis). This causes damage to the cartilage, which can break down over time. The knees, hips, back and neck are most often affected by this type of arthritis. °Other types of arthritis and common causes of joint pain include: °· Sprains and other injuries near the joint. Sometimes minor sprains and injuries cause pain and swelling that develop hours later. °· Rheumatoid arthritis. This affects hands, feet and knees. It usually affects both sides of your body at the same time. It is often associated with chronic ailments, fever, weight loss and general weakness. °· Crystal arthritis. Gout and pseudo gout can cause occasional acute severe pain, redness and swelling in the foot, ankle, or knee. °· Infectious arthritis. Bacteria can get into a joint through a break in overlying skin. This can cause infection of the joint. Bacteria and viruses can also spread through the blood and affect your joints. °· Drug, infectious and allergy reactions. Sometimes joints can become mildly painful and slightly swollen with these types of illnesses. °SYMPTOMS  °· Pain is the main symptom. °· Your joint or joints can also be red, swollen and warm or hot to the touch. °· You may have a fever with certain types of arthritis, or even feel overall ill. °· The joint with arthritis will hurt with movement. Stiffness is present with some types of arthritis. °DIAGNOSIS  °Your caregiver will suspect arthritis based on your description of your symptoms and on your exam. Testing may be needed to find the type of arthritis: °· Blood and sometimes urine tests. °· X-ray tests  and sometimes CT or MRI scans. °· Removal of fluid from the joint (arthrocentesis) is done to check for bacteria, crystals or other causes. Your caregiver (or a specialist) will numb the area over the joint with a local anesthetic, and use a needle to remove joint fluid for examination. This procedure is only minimally uncomfortable. °· Even with these tests, your caregiver may not be able to tell what kind of arthritis you have. Consultation with a specialist (rheumatologist) may be helpful. °TREATMENT  °Your caregiver will discuss with you treatment specific to your type of arthritis. If the specific type cannot be determined, then the following general recommendations may apply. °Treatment of severe joint pain includes: °· Rest. °· Elevation. °· Anti-inflammatory medication (for example, ibuprofen) may be prescribed. Avoiding activities that cause increased pain. °· Only take over-the-counter or prescription medicines for pain and discomfort as recommended by your caregiver. °· Cold packs over an inflamed joint may be used for 10 to 15 minutes every hour. Hot packs sometimes feel better, but do not use overnight. Do not use hot packs if you are diabetic without your caregiver's permission. °· A cortisone shot into arthritic joints may help reduce pain and swelling. °· Any acute arthritis that gets worse over the next 1 to 2 days needs to be looked at to be sure there is no joint infection. °Long-term arthritis treatment involves modifying activities and lifestyle to reduce joint stress jarring. This can include weight loss. Also, exercise is needed to nourish the joint cartilage and remove waste. This helps keep the muscles   around the joint strong. °HOME CARE INSTRUCTIONS  °· Do not take aspirin to relieve pain if gout is suspected. This elevates uric acid levels. °· Only take over-the-counter or prescription medicines for pain, discomfort or fever as directed by your caregiver. °· Rest the joint as much as  possible. °· If your joint is swollen, keep it elevated. °· Use crutches if the painful joint is in your leg. °· Drinking plenty of fluids may help for certain types of arthritis. °· Follow your caregiver's dietary instructions. °· Try low-impact exercise such as: °¨ Swimming. °¨ Water aerobics. °¨ Biking. °¨ Walking. °· Morning stiffness is often relieved by a warm shower. °· Put your joints through regular range-of-motion. °SEEK MEDICAL CARE IF:  °· You do not feel better in 24 hours or are getting worse. °· You have side effects to medications, or are not getting better with treatment. °SEEK IMMEDIATE MEDICAL CARE IF:  °· You have a fever. °· You develop severe joint pain, swelling or redness. °· Many joints are involved and become painful and swollen. °· There is severe back pain and/or leg weakness. °· You have loss of bowel or bladder control. °Document Released: 12/22/2004 Document Revised: 02/06/2012 Document Reviewed: 01/07/2009 °[employer]® Patient Information ©2015 [employer]. This information is not intended to replace advice given to you by your health care provider. Make sure you discuss any questions you have with your health care provider. ° °

## 2015-08-24 NOTE — Assessment & Plan Note (Signed)
Viagra refilled today 

## 2015-08-24 NOTE — Assessment & Plan Note (Signed)
Uncontrolled Discussed the importance of medication compliance in combination with low carb diet He does not want to end up on insulin Flu shot today Pneumovax UTD Foot exam today Advised him to make an appt for an eye appt A1C and microalbumin today Continue current medications at this time

## 2015-08-24 NOTE — Assessment & Plan Note (Signed)
Chronic in back He is applying for disability

## 2015-08-24 NOTE — Assessment & Plan Note (Signed)
Will recheck Lipid Profile and CMET today Encouraged him to consume a low fat diet

## 2015-08-24 NOTE — Assessment & Plan Note (Signed)
Will check TSH and Free T4 today

## 2015-08-25 LAB — COMPREHENSIVE METABOLIC PANEL
ALBUMIN: 3.9 g/dL (ref 3.5–5.2)
ALK PHOS: 62 U/L (ref 39–117)
ALT: 21 U/L (ref 0–53)
AST: 19 U/L (ref 0–37)
BILIRUBIN TOTAL: 0.7 mg/dL (ref 0.2–1.2)
BUN: 6 mg/dL (ref 6–23)
CO2: 27 mEq/L (ref 19–32)
Calcium: 9.6 mg/dL (ref 8.4–10.5)
Chloride: 107 mEq/L (ref 96–112)
Creatinine, Ser: 1.29 mg/dL (ref 0.40–1.50)
GFR: 73.27 mL/min (ref >60.00)
GLUCOSE: 123 mg/dL — AB (ref 70–99)
Potassium: 4.5 mEq/L (ref 3.5–5.1)
SODIUM: 141 meq/L (ref 135–145)
Total Protein: 7 g/dL (ref 6.0–8.3)

## 2015-08-25 LAB — CBC
HEMATOCRIT: 38.4 % — AB (ref 39.0–52.0)
HEMOGLOBIN: 12.7 g/dL — AB (ref 13.0–17.0)
MCHC: 33.2 g/dL (ref 30.0–36.0)
MCV: 88.8 fl (ref 78.0–100.0)
Platelets: 201 10*3/uL (ref 150.0–400.0)
RBC: 4.32 Mil/uL (ref 4.22–5.81)
RDW: 14.4 % (ref 11.5–15.5)
WBC: 4.4 10*3/uL (ref 4.0–10.5)

## 2015-08-25 LAB — LIPID PANEL
Cholesterol: 155 mg/dL (ref 0–200)
HDL: 39.1 mg/dL (ref >39.00)
LDL CALC: 96 mg/dL (ref 0–99)
NONHDL: 115.6
Total CHOL/HDL Ratio: 4
Triglycerides: 99 mg/dL (ref 0.0–149.0)
VLDL: 19.8 mg/dL (ref 0.0–40.0)

## 2015-08-25 LAB — PSA: PSA: 0.55 ng/mL (ref 0.10–4.00)

## 2015-08-25 LAB — T4, FREE: Free T4: 1.1 ng/dL (ref 0.60–1.60)

## 2015-08-25 LAB — MICROALBUMIN, URINE: Microalb, Ur: 0.2 mg/dL (ref <2.0)

## 2015-08-25 LAB — HIV ANTIBODY (ROUTINE TESTING W REFLEX): HIV 1&2 Ab, 4th Generation: NONREACTIVE

## 2015-08-25 LAB — TSH: TSH: 0.29 u[IU]/mL — AB (ref 0.35–4.50)

## 2015-08-25 LAB — HEMOGLOBIN A1C: HEMOGLOBIN A1C: 9.7 % — AB (ref 4.6–6.5)

## 2015-08-27 ENCOUNTER — Other Ambulatory Visit: Payer: Self-pay | Admitting: Internal Medicine

## 2015-08-27 ENCOUNTER — Other Ambulatory Visit: Payer: Self-pay

## 2015-08-27 DIAGNOSIS — E1165 Type 2 diabetes mellitus with hyperglycemia: Secondary | ICD-10-CM

## 2015-08-27 DIAGNOSIS — IMO0002 Reserved for concepts with insufficient information to code with codable children: Secondary | ICD-10-CM

## 2015-08-27 DIAGNOSIS — F411 Generalized anxiety disorder: Secondary | ICD-10-CM

## 2015-08-27 NOTE — Telephone Encounter (Signed)
Pt seen 08/24/15 for f/u and pt forgot to ask for refill xanax due to anxiety worsening. Pt request refill xanax to [facility] [address]. Please advise.  Melanie  CMA advised no long fill controlled substances for pt. I called pt back and advised could not request the xanax for refill. Pt thanked me and hung up.

## 2015-08-28 MED ORDER — LEVOTHYROXINE SODIUM 100 MCG PO TABS: 100.0000 ug | ORAL_TABLET | Freq: Every day | ORAL | Status: DC

## 2015-08-28 NOTE — Addendum Note (Signed)
Addended by: Lurlean Nanny on: 08/28/2015 11:33 AM   Modules accepted: Orders, Medications

## 2015-09-14 IMAGING — CR DG ABD PORTABLE 1V
1 series · 1 of 1 positions shown · non-contrast
Comparison: 02/05/2013

CLINICAL DATA: Drug overdose with cocaine 4 hours ago.

EXAM:
PORTABLE ABDOMEN - 1 VIEW

[AP]
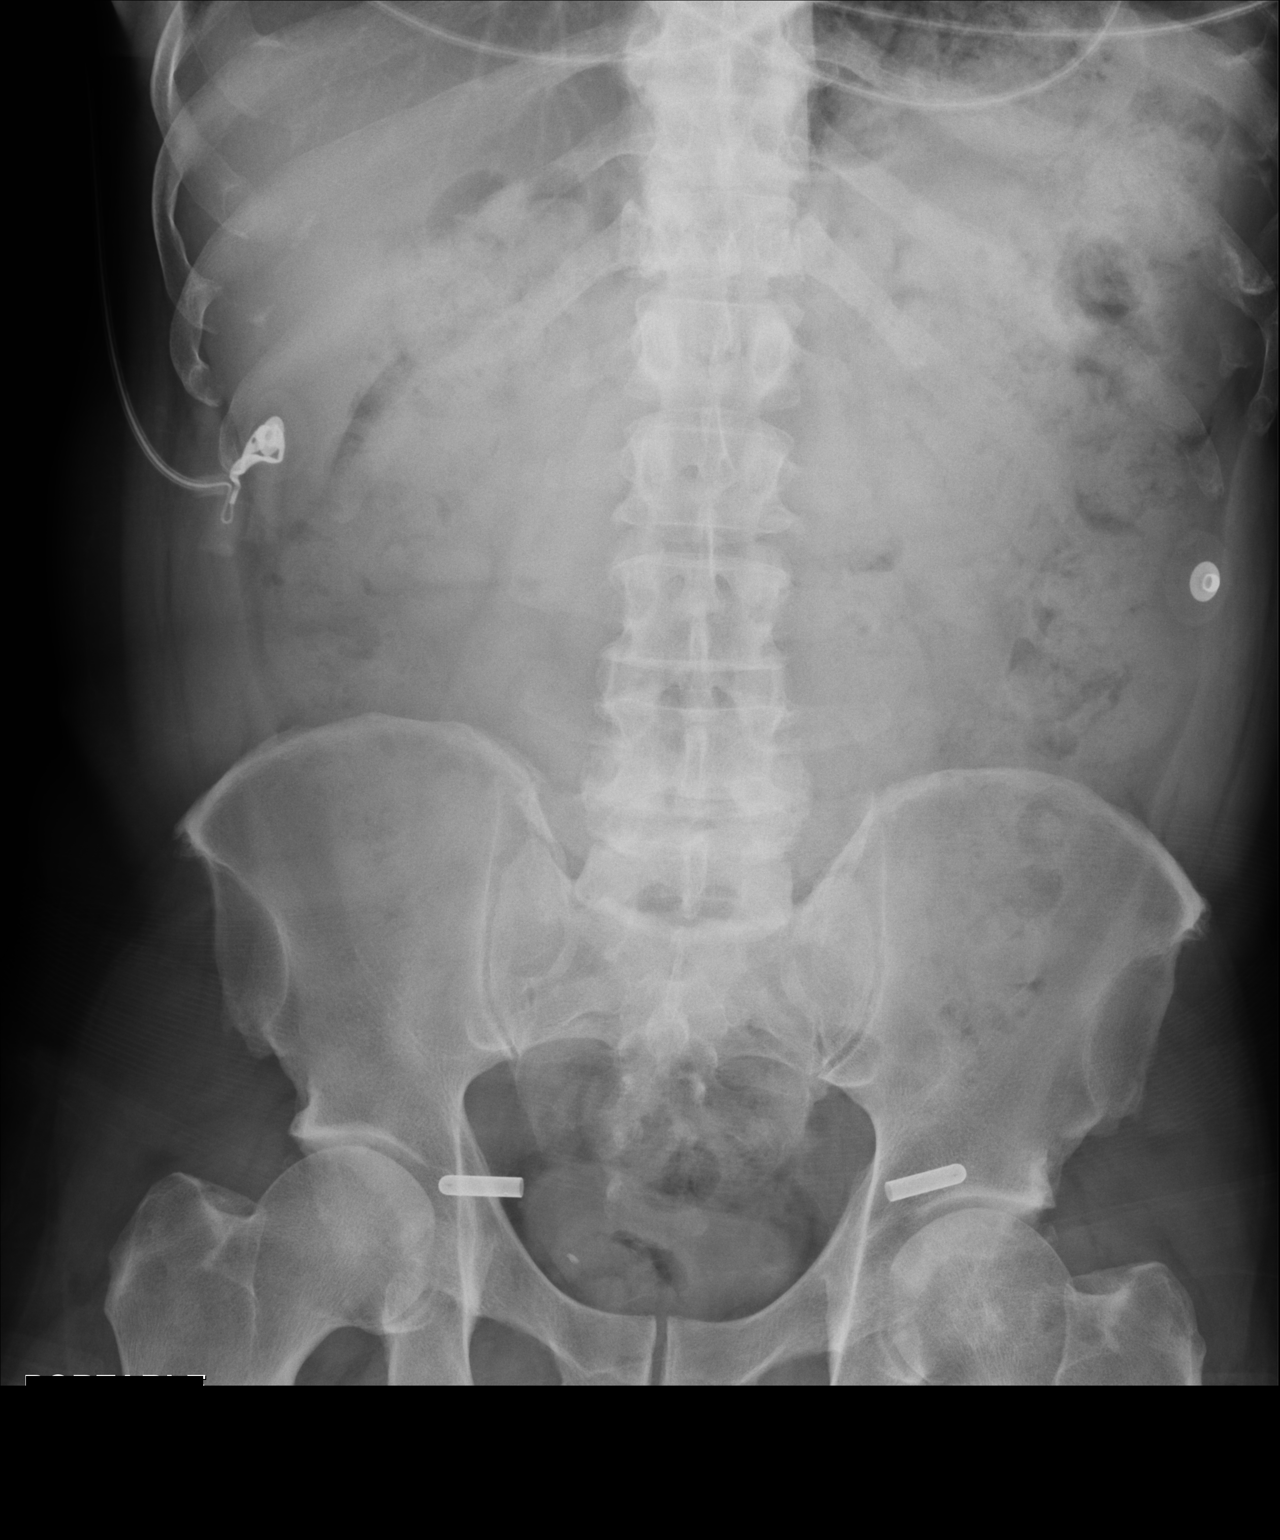

[1 of 1 positions shown; findings below may reference images not displayed]

FINDINGS: Bowel gas pattern is nonobstructive with mild fecal retention
throughout the colon. There are 2 cylindrical foreign bodies
measuring 2.1 cm in length projected over the pelvis bilaterally.
Minimal degenerative change of the spine and hips.
IMPRESSION: Nonobstructive bowel gas pattern.

Two small cylindrical symmetrical foreign bodies over the pelvis
bilaterally of uncertain clinical significance.

## 2015-09-19 ENCOUNTER — Other Ambulatory Visit: Payer: Self-pay | Admitting: Internal Medicine

## 2015-10-14 ENCOUNTER — Other Ambulatory Visit: Payer: Self-pay | Admitting: Internal Medicine

## 2015-10-20 ENCOUNTER — Other Ambulatory Visit: Payer: Self-pay | Admitting: Internal Medicine

## 2015-11-19 ENCOUNTER — Other Ambulatory Visit: Payer: Self-pay | Admitting: Internal Medicine

## 2016-02-22 ENCOUNTER — Encounter: Payer: 59 | Admitting: Internal Medicine

## 2016-02-29 ENCOUNTER — Encounter: Payer: 59 | Admitting: Internal Medicine

## 2016-02-29 DIAGNOSIS — Z0289 Encounter for other administrative examinations: Secondary | ICD-10-CM

## 2016-03-01 ENCOUNTER — Telehealth: Payer: Self-pay | Admitting: Internal Medicine

## 2016-03-01 NOTE — Telephone Encounter (Signed)
Patient did not come for their scheduled appointment 4/4 for cpx.  Please let me know if the patient needs to be contacted immediately for follow up or if no follow up is necessary.

## 2016-03-01 NOTE — Telephone Encounter (Signed)
Yes, he needs to reschedule 

## 2016-03-03 ENCOUNTER — Encounter: Payer: Self-pay | Admitting: Internal Medicine

## 2016-03-03 NOTE — Telephone Encounter (Signed)
Mailed letter °

## 2016-03-03 NOTE — Telephone Encounter (Signed)
Sent letter phone not working

## 2019-06-24 ENCOUNTER — Other Ambulatory Visit: Payer: Self-pay

## 2019-06-24 ENCOUNTER — Ambulatory Visit: Payer: Self-pay | Admitting: Internal Medicine

## 2019-06-24 ENCOUNTER — Telehealth: Payer: Self-pay | Admitting: *Deleted

## 2019-06-24 ENCOUNTER — Encounter (INDEPENDENT_AMBULATORY_CARE_PROVIDER_SITE_OTHER): Payer: Self-pay

## 2019-06-24 ENCOUNTER — Encounter: Payer: Self-pay | Admitting: Internal Medicine

## 2019-06-24 VITALS — BP 135/69 | HR 79 | Temp 98.1°F | Ht 66.0 in | Wt 152.7 lb

## 2019-06-24 DIAGNOSIS — M1990 Unspecified osteoarthritis, unspecified site: Secondary | ICD-10-CM

## 2019-06-24 DIAGNOSIS — M47816 Spondylosis without myelopathy or radiculopathy, lumbar region: Secondary | ICD-10-CM

## 2019-06-24 DIAGNOSIS — G8929 Other chronic pain: Secondary | ICD-10-CM

## 2019-06-24 DIAGNOSIS — M79674 Pain in right toe(s): Secondary | ICD-10-CM

## 2019-06-24 DIAGNOSIS — I10 Essential (primary) hypertension: Secondary | ICD-10-CM

## 2019-06-24 DIAGNOSIS — Z7984 Long term (current) use of oral hypoglycemic drugs: Secondary | ICD-10-CM

## 2019-06-24 DIAGNOSIS — E119 Type 2 diabetes mellitus without complications: Secondary | ICD-10-CM

## 2019-06-24 DIAGNOSIS — Z79890 Hormone replacement therapy (postmenopausal): Secondary | ICD-10-CM

## 2019-06-24 DIAGNOSIS — M48061 Spinal stenosis, lumbar region without neurogenic claudication: Secondary | ICD-10-CM

## 2019-06-24 DIAGNOSIS — Z79899 Other long term (current) drug therapy: Secondary | ICD-10-CM

## 2019-06-24 DIAGNOSIS — E039 Hypothyroidism, unspecified: Secondary | ICD-10-CM

## 2019-06-24 LAB — POCT GLYCOSYLATED HEMOGLOBIN (HGB A1C): Hemoglobin A1C: 7.8 % — AB (ref 4.0–5.6)

## 2019-06-24 LAB — GLUCOSE, CAPILLARY: Glucose-Capillary: 248 mg/dL — ABNORMAL HIGH (ref 70–99)

## 2019-06-24 MED ORDER — DICLOFENAC SODIUM 1 % TD GEL: 2.0000 g | Freq: Four times a day (QID) | TRANSDERMAL | 0 refills | Status: DC

## 2019-06-24 MED ORDER — MELOXICAM 7.5 MG PO TABS: 7.5000 mg | ORAL_TABLET | Freq: Every day | ORAL | 4 refills | Status: DC

## 2019-06-24 MED ORDER — METFORMIN HCL 500 MG PO TABS: 500.0000 mg | ORAL_TABLET | Freq: Every day | ORAL | 11 refills | Status: DC

## 2019-06-24 MED FILL — metFORMIN HCL 500 MG TABS: 500 | 30 days supply | Qty: 30 | Fill #0

## 2019-06-24 MED FILL — DICLOFENAC SODIUM 1 % GEL: 1 | 13 days supply | Qty: 100 | Fill #0

## 2019-06-24 MED FILL — MELOXICAM 7.5 MG TABLET: 7.5 | 30 days supply | Qty: 30 | Fill #0

## 2019-06-24 NOTE — Patient Instructions (Signed)
Thank you for allowing [place] to provide your care today. Today we discussed your type II diabetes, hypertension, and low back pain.     I have ordered the following labs for you:    I will call if any are abnormal.    Today we made the following changes to your medications:   Please START taking  Metformin (glucophage) 500 mg tablet once per day.  In one week, if you have no symptoms of low blood sugar, increase to 1000 mg - two tablets once pre day.  Please STOP taking   Glipizide 10 mg tablet   Please follow-up in two weeks. Please check your glucose two times per day, prior to breakfast and then prior to lunch or dinner and bring your readings with you.    Should you have any questions or concerns please call the internal medicine clinic at [phone].

## 2019-06-24 NOTE — Progress Notes (Signed)
   CC: establish care, back pain  HPI:  [name] is a 63 y.o. with PMH as below presenting to the clinic to establish care. He was just released four days from incarceration for the past four years. He has chronic back pain, which is central and in the lower lumbar region, non-radiating. He was previously treated with opioids four years ago but has only been on OTC NSAIDs and tylenol for the past four years, which have not helped.   Please see A&P for assessment of the patient's acute and chronic medical conditions.   Past Medical History:  Diagnosis Date  . Diabetes mellitus without complication ([place])   . Hypothyroidism    Social History:  Never smoked Does not drink alcohol, does not use drugs recreationally  Staying with his sister since release from incarceration four days ago.  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   Family History: Mom - diabetic, hypertension  Father - passed away when he was young   Review of Systems:   Review of Systems  Constitutional: Negative for chills, fever, malaise/fatigue and weight loss.  HENT: Negative for congestion and sore throat.   Respiratory: Negative for cough and shortness of breath.   Cardiovascular: Negative for chest pain and leg swelling.  Gastrointestinal: Negative for abdominal pain, constipation, diarrhea and nausea.  Genitourinary: Negative for dysuria, frequency and urgency.  Musculoskeletal: Positive for back pain and joint pain (right toe pain).  Neurological: Negative for dizziness, tingling, sensory change and headaches.  Endo/Heme/Allergies: Positive for polydipsia.  Psychiatric/Behavioral: Negative for depression and substance abuse.   Physical Exam:  Constitution: NAD, healthy appearing male  HENT: AT/[place] Cardio: RRR, no m/r/g; no LE edema  Respiratory: CTA, no w/r/r  Abdominal: soft, +BS, non-distended, NTTP  MSK: strength 5/5 UE and LE b/l; TTP central lower lumbar region, full active ROM Neuro: alert and  oriented, normal affect  Skin: c/d/i    Vitals:   06/24/19 1427  BP: 135/69  Pulse: 79  Temp: 98.1 F (36.7 C)  TempSrc: Oral  SpO2: 100%  Weight: 152 lb 11.2 oz (69.3 kg)  Height: 5\' 6"  (1.676 m)     Assessment & Plan:   See Encounters Tab for problem based charting.  Patient discussed with Dr. Dareen Piano

## 2019-06-24 NOTE — Telephone Encounter (Signed)
Call from [facility] regarding diclofenac sodium 1% gel-50 gram tube available otc, but pharmacy does not have it in.  Pharmacy requesting to have rx changed to 100 gram tube and to add IM Program to rx.  Discussed with ordering MD-verbal order given to change to 100 gram tube to be covered under IM Program.Jaris Kohles Cassady7/27/20204:13 PM  rx changed in epic.

## 2019-06-25 DIAGNOSIS — I10 Essential (primary) hypertension: Secondary | ICD-10-CM | POA: Insufficient documentation

## 2019-06-25 MED ORDER — LISINOPRIL 10 MG PO TABS: 10.0000 mg | ORAL_TABLET | Freq: Every day | ORAL | Status: DC

## 2019-06-25 NOTE — Assessment & Plan Note (Signed)
He has non-radiating chronic lower back pain for many years with x-ray in 2016 showing narrowing of the L4-L5 disc space and degenerative spondylosis. He was previously treated with hydrocodone 10 mg bid and meloxicam but has not been on this medication for four years due to incarceration. He states OTC NSAIDs and tylenol do not work.  We had a long discussion that opioids are not efficacious long-term treatment for chronic back pain, and that the risks outweigh the benefits. No alarm symptoms at this time, but consider further imaging once orange card is available and if symptoms do not improve  - meloxicam 7.5 mg qd - can increased to bid if needed  - BMP at follow-up - will likely need repeat imaging after obtaining orange card  - discussed possible exercises and stretching - he states this has not helped in the past - consider PT referral as well if symptoms do not improve.

## 2019-06-25 NOTE — Assessment & Plan Note (Signed)
He is not sure exactly why he has hypothyroidism but has been taking he levothyroxine. He would like to wait until he has the orange card to check TSH. No concerning symptoms of hyper or hypoglycemia at this time.   - TSH after obtaining orange card (appt. August 12th)

## 2019-06-25 NOTE — Assessment & Plan Note (Signed)
He is on lisinopril 10 mg, blood pressure well controlled on this medication.   - BMP once he has orange card  - lipid panel at f/u

## 2019-06-25 NOTE — Assessment & Plan Note (Signed)
He has been on glipizide 10 mg only for the past four years, prior to this he was on metformin 1,000 mg qd and tolerated this well. He has had no symptoms of hypoglycemia, and hemoglobin a1c is 7.8 today. He checks glucose every morning and states this runs between 80-100.   - switch to metformin 500 mg qd - after one week if no symptoms of hypoglycemia increase to 1,000 mg qd  - stop glipizide  - f/u two weeks with glucose readings

## 2019-06-26 NOTE — Progress Notes (Signed)
Internal Medicine Clinic Attending  Case discussed with Dr. Seawell at the time of the visit.  We reviewed the resident's history and exam and pertinent patient test results.  I agree with the assessment, diagnosis, and plan of care documented in the resident's note.    

## 2019-07-10 ENCOUNTER — Ambulatory Visit: Payer: Self-pay

## 2019-07-12 ENCOUNTER — Inpatient Hospital Stay (HOSPITAL_COMMUNITY): Payer: Self-pay

## 2019-07-12 ENCOUNTER — Inpatient Hospital Stay (HOSPITAL_COMMUNITY): Payer: Self-pay | Admitting: Anesthesiology

## 2019-07-12 ENCOUNTER — Encounter (HOSPITAL_COMMUNITY): Payer: Self-pay | Admitting: *Deleted

## 2019-07-12 ENCOUNTER — Emergency Department (HOSPITAL_COMMUNITY): Payer: Self-pay

## 2019-07-12 ENCOUNTER — Inpatient Hospital Stay (HOSPITAL_COMMUNITY)
Admission: EM | Admit: 2019-07-12 | Discharge: 2019-07-15 | DRG: 659 | Disposition: A | Discharge status: Home / Self Care | Payer: HRSA Program | Attending: Internal Medicine | Admitting: Internal Medicine

## 2019-07-12 ENCOUNTER — Encounter (HOSPITAL_COMMUNITY)
Admission: EM | Disposition: A | Discharge status: Home / Self Care | Payer: Self-pay | Source: Home / Self Care | Attending: Internal Medicine

## 2019-07-12 ENCOUNTER — Other Ambulatory Visit: Payer: Self-pay

## 2019-07-12 DIAGNOSIS — N132 Hydronephrosis with renal and ureteral calculous obstruction: Principal | ICD-10-CM | POA: Diagnosis present

## 2019-07-12 DIAGNOSIS — I10 Essential (primary) hypertension: Secondary | ICD-10-CM | POA: Diagnosis present

## 2019-07-12 DIAGNOSIS — Z811 Family history of alcohol abuse and dependence: Secondary | ICD-10-CM

## 2019-07-12 DIAGNOSIS — Z841 Family history of disorders of kidney and ureter: Secondary | ICD-10-CM

## 2019-07-12 DIAGNOSIS — Z79890 Hormone replacement therapy (postmenopausal): Secondary | ICD-10-CM

## 2019-07-12 DIAGNOSIS — G8929 Other chronic pain: Secondary | ICD-10-CM | POA: Diagnosis present

## 2019-07-12 DIAGNOSIS — U071 COVID-19: Secondary | ICD-10-CM | POA: Diagnosis not present

## 2019-07-12 DIAGNOSIS — E785 Hyperlipidemia, unspecified: Secondary | ICD-10-CM | POA: Diagnosis present

## 2019-07-12 DIAGNOSIS — I129 Hypertensive chronic kidney disease with stage 1 through stage 4 chronic kidney disease, or unspecified chronic kidney disease: Secondary | ICD-10-CM | POA: Diagnosis present

## 2019-07-12 DIAGNOSIS — Z823 Family history of stroke: Secondary | ICD-10-CM

## 2019-07-12 DIAGNOSIS — N189 Chronic kidney disease, unspecified: Secondary | ICD-10-CM

## 2019-07-12 DIAGNOSIS — N200 Calculus of kidney: Secondary | ICD-10-CM

## 2019-07-12 DIAGNOSIS — E119 Type 2 diabetes mellitus without complications: Secondary | ICD-10-CM

## 2019-07-12 DIAGNOSIS — Z79899 Other long term (current) drug therapy: Secondary | ICD-10-CM

## 2019-07-12 DIAGNOSIS — N184 Chronic kidney disease, stage 4 (severe): Secondary | ICD-10-CM | POA: Diagnosis present

## 2019-07-12 DIAGNOSIS — N201 Calculus of ureter: Secondary | ICD-10-CM

## 2019-07-12 DIAGNOSIS — E039 Hypothyroidism, unspecified: Secondary | ICD-10-CM | POA: Diagnosis present

## 2019-07-12 DIAGNOSIS — E1122 Type 2 diabetes mellitus with diabetic chronic kidney disease: Secondary | ICD-10-CM | POA: Diagnosis present

## 2019-07-12 DIAGNOSIS — Z7984 Long term (current) use of oral hypoglycemic drugs: Secondary | ICD-10-CM

## 2019-07-12 DIAGNOSIS — Z8249 Family history of ischemic heart disease and other diseases of the circulatory system: Secondary | ICD-10-CM

## 2019-07-12 DIAGNOSIS — Z419 Encounter for procedure for purposes other than remedying health state, unspecified: Secondary | ICD-10-CM

## 2019-07-12 DIAGNOSIS — D509 Iron deficiency anemia, unspecified: Secondary | ICD-10-CM | POA: Diagnosis present

## 2019-07-12 DIAGNOSIS — E872 Acidosis: Secondary | ICD-10-CM | POA: Diagnosis present

## 2019-07-12 DIAGNOSIS — E875 Hyperkalemia: Secondary | ICD-10-CM

## 2019-07-12 DIAGNOSIS — Z833 Family history of diabetes mellitus: Secondary | ICD-10-CM

## 2019-07-12 DIAGNOSIS — Z791 Long term (current) use of non-steroidal anti-inflammatories (NSAID): Secondary | ICD-10-CM

## 2019-07-12 DIAGNOSIS — N179 Acute kidney failure, unspecified: Secondary | ICD-10-CM | POA: Diagnosis present

## 2019-07-12 DIAGNOSIS — D61818 Other pancytopenia: Secondary | ICD-10-CM | POA: Diagnosis present

## 2019-07-12 HISTORY — PX: URETEROSCOPY: SHX842

## 2019-07-12 HISTORY — PX: CYSTOSCOPY W/ URETERAL STENT PLACEMENT: SHX1429

## 2019-07-12 LAB — COMPREHENSIVE METABOLIC PANEL
ALT: 19 U/L (ref 0–44)
AST: 20 U/L (ref 15–41)
Albumin: 3.3 g/dL — ABNORMAL LOW (ref 3.5–5.0)
Alkaline Phosphatase: 79 U/L (ref 38–126)
Anion gap: 9 (ref 5–15)
BUN: 15 mg/dL (ref 8–23)
CO2: 17 mmol/L — ABNORMAL LOW (ref 22–32)
Calcium: 9 mg/dL (ref 8.9–10.3)
Chloride: 110 mmol/L (ref 98–111)
Creatinine, Ser: 3.87 mg/dL — ABNORMAL HIGH (ref 0.61–1.24)
GFR calc Af Amer: 18 mL/min — ABNORMAL LOW (ref >60)
GFR calc non Af Amer: 16 mL/min — ABNORMAL LOW (ref >60)
Glucose, Bld: 227 mg/dL — ABNORMAL HIGH (ref 70–99)
Potassium: 5.8 mmol/L — ABNORMAL HIGH (ref 3.5–5.1)
Sodium: 136 mmol/L (ref 135–145)
Total Bilirubin: 0.7 mg/dL (ref 0.3–1.2)
Total Protein: 7.4 g/dL (ref 6.5–8.1)

## 2019-07-12 LAB — URINALYSIS, ROUTINE W REFLEX MICROSCOPIC
Bilirubin Urine: NEGATIVE
Glucose, UA: 150 mg/dL — AB
Ketones, ur: NEGATIVE mg/dL
Leukocytes,Ua: NEGATIVE
Nitrite: NEGATIVE
Protein, ur: NEGATIVE mg/dL
Specific Gravity, Urine: 1.011 (ref 1.005–1.030)
pH: 5 (ref 5.0–8.0)

## 2019-07-12 LAB — CBC
HCT: 29.7 % — ABNORMAL LOW (ref 39.0–52.0)
Hemoglobin: 9.3 g/dL — ABNORMAL LOW (ref 13.0–17.0)
MCH: 29.6 pg (ref 26.0–34.0)
MCHC: 31.3 g/dL (ref 30.0–36.0)
MCV: 94.6 fL (ref 80.0–100.0)
Platelets: 211 10*3/uL (ref 150–400)
RBC: 3.14 MIL/uL — ABNORMAL LOW (ref 4.22–5.81)
RDW: 12.8 % (ref 11.5–15.5)
WBC: 5.3 10*3/uL (ref 4.0–10.5)
nRBC: 0 % (ref 0.0–0.2)

## 2019-07-12 LAB — POTASSIUM
Potassium: 6.3 mmol/L (ref 3.5–5.1)
Potassium: 6.2 mmol/L — ABNORMAL HIGH (ref 3.5–5.1)
Potassium: 5.6 mmol/L — ABNORMAL HIGH (ref 3.5–5.1)
Potassium: 5.3 mmol/L — ABNORMAL HIGH (ref 3.5–5.1)

## 2019-07-12 LAB — LIPASE, BLOOD: Lipase: 51 U/L (ref 11–51)

## 2019-07-12 LAB — NA AND K (SODIUM & POTASSIUM), RAND UR
Potassium Urine: 39 mmol/L
Sodium, Ur: 105 mmol/L

## 2019-07-12 LAB — GLUCOSE, CAPILLARY
Glucose-Capillary: 85 mg/dL (ref 70–99)
Glucose-Capillary: 124 mg/dL — ABNORMAL HIGH (ref 70–99)

## 2019-07-12 LAB — CBG MONITORING, ED: Glucose-Capillary: 173 mg/dL — ABNORMAL HIGH (ref 70–99)

## 2019-07-12 LAB — SARS CORONAVIRUS 2 BY RT PCR (HOSPITAL ORDER, PERFORMED IN ~~LOC~~ HOSPITAL LAB): SARS Coronavirus 2: POSITIVE — AB

## 2019-07-12 SURGERY — CYSTOSCOPY, WITH RETROGRADE PYELOGRAM AND URETERAL STENT INSERTION
Anesthesia: General | Site: Ureter | Laterality: Left

## 2019-07-12 MED ORDER — SODIUM CHLORIDE 0.9 % IV SOLN
INTRAVENOUS | Status: DC | PRN
  Administered 2019-07-12: 18:00:00 30 ug/min via INTRAVENOUS

## 2019-07-12 MED ORDER — ONDANSETRON HCL 4 MG/2ML IJ SOLN
INTRAMUSCULAR | Status: DC | PRN
  Administered 2019-07-12: 4 mg via INTRAVENOUS

## 2019-07-12 MED ORDER — IOHEXOL 300 MG/ML  SOLN
INTRAMUSCULAR | Status: DC | PRN
  Administered 2019-07-12: 19:00:00 7.5 mL

## 2019-07-12 MED ORDER — 0.9 % SODIUM CHLORIDE (POUR BTL) OPTIME
TOPICAL | Status: DC | PRN
  Administered 2019-07-12: 1000 mL

## 2019-07-12 MED ORDER — METFORMIN HCL 500 MG PO TABS: 500.0000 mg | ORAL_TABLET | Freq: Every day | ORAL | Status: DC

## 2019-07-12 MED ORDER — SUGAMMADEX SODIUM 200 MG/2ML IV SOLN
INTRAVENOUS | Status: DC | PRN
  Administered 2019-07-12: 100 mg via INTRAVENOUS
  Administered 2019-07-12: 180 mg via INTRAVENOUS

## 2019-07-12 MED ORDER — ROCURONIUM BROMIDE 10 MG/ML (PF) SYRINGE
PREFILLED_SYRINGE | INTRAVENOUS | Status: DC | PRN
  Administered 2019-07-12: 40 mg via INTRAVENOUS

## 2019-07-12 MED ORDER — ESMOLOL HCL 100 MG/10ML IV SOLN
INTRAVENOUS | Status: DC | PRN
  Administered 2019-07-12: 30 mg via INTRAVENOUS

## 2019-07-12 MED ORDER — PHENYLEPHRINE 40 MCG/ML (10ML) SYRINGE FOR IV PUSH (FOR BLOOD PRESSURE SUPPORT)
PREFILLED_SYRINGE | INTRAVENOUS | Status: DC | PRN
  Administered 2019-07-12: 80 ug via INTRAVENOUS

## 2019-07-12 MED ORDER — LIDOCAINE 2% (20 MG/ML) 5 ML SYRINGE
INTRAMUSCULAR | Status: DC | PRN
  Administered 2019-07-12: 60 mg via INTRAVENOUS

## 2019-07-12 MED ORDER — ACETAMINOPHEN 650 MG RE SUPP: 650.0000 mg | Freq: Four times a day (QID) | RECTAL | Status: DC | PRN

## 2019-07-12 MED ORDER — LIDOCAINE 2% (20 MG/ML) 5 ML SYRINGE
INTRAMUSCULAR | Status: AC
  Filled 2019-07-12: qty 10

## 2019-07-12 MED ORDER — SODIUM CHLORIDE 0.9 % IV SOLN
1.0000 g | Freq: Once | INTRAVENOUS | Status: AC
  Administered 2019-07-12: 1 g via INTRAVENOUS
  Filled 2019-07-12: qty 10

## 2019-07-12 MED ORDER — PHENYLEPHRINE 40 MCG/ML (10ML) SYRINGE FOR IV PUSH (FOR BLOOD PRESSURE SUPPORT)
PREFILLED_SYRINGE | INTRAVENOUS | Status: AC
  Filled 2019-07-12: qty 10

## 2019-07-12 MED ORDER — SODIUM CHLORIDE 0.9 % IV SOLN
INTRAVENOUS | Status: DC | PRN
  Administered 2019-07-12: 18:00:00 via INTRAVENOUS

## 2019-07-12 MED ORDER — FENTANYL CITRATE (PF) 250 MCG/5ML IJ SOLN
INTRAMUSCULAR | Status: AC
  Filled 2019-07-12: qty 5

## 2019-07-12 MED ORDER — PHENOL 1.4 % MT LIQD
1.0000 | OROMUCOSAL | Status: DC | PRN
  Administered 2019-07-12: 1 via OROMUCOSAL
  Filled 2019-07-12: qty 177

## 2019-07-12 MED ORDER — SODIUM CHLORIDE 0.9 % IV SOLN
INTRAVENOUS | Status: DC
  Administered 2019-07-12 – 2019-07-14 (×6): via INTRAVENOUS

## 2019-07-12 MED ORDER — ACETAMINOPHEN 325 MG PO TABS: 650.0000 mg | ORAL_TABLET | Freq: Four times a day (QID) | ORAL | Status: DC | PRN

## 2019-07-12 MED ORDER — POLYETHYLENE GLYCOL 3350 17 G PO PACK: 17.0000 g | PACK | Freq: Every day | ORAL | Status: DC | PRN

## 2019-07-12 MED ORDER — LEVOTHYROXINE SODIUM 112 MCG PO TABS
112.0000 ug | ORAL_TABLET | Freq: Every day | ORAL | Status: DC
  Administered 2019-07-12 – 2019-07-15 (×4): 112 ug via ORAL
  Filled 2019-07-12 (×4): qty 1

## 2019-07-12 MED ORDER — LISINOPRIL 10 MG PO TABS: 10.0000 mg | ORAL_TABLET | Freq: Every day | ORAL | Status: DC

## 2019-07-12 MED ORDER — SODIUM ZIRCONIUM CYCLOSILICATE 10 G PO PACK
10.0000 g | PACK | Freq: Three times a day (TID) | ORAL | Status: DC
  Administered 2019-07-12 – 2019-07-14 (×6): 10 g via ORAL
  Filled 2019-07-12 (×8): qty 1

## 2019-07-12 MED ORDER — INSULIN ASPART 100 UNIT/ML ~~LOC~~ SOLN
0.0000 [IU] | SUBCUTANEOUS | Status: DC
  Administered 2019-07-12 – 2019-07-13 (×2): 2 [IU] via SUBCUTANEOUS

## 2019-07-12 MED ORDER — MORPHINE SULFATE (PF) 2 MG/ML IV SOLN
2.0000 mg | Freq: Once | INTRAVENOUS | Status: AC
  Administered 2019-07-12: 2 mg via INTRAVENOUS
  Filled 2019-07-12: qty 1

## 2019-07-12 MED ORDER — PROPOFOL 10 MG/ML IV BOLUS
INTRAVENOUS | Status: DC | PRN
  Administered 2019-07-12: 150 mg via INTRAVENOUS

## 2019-07-12 MED ORDER — FENTANYL CITRATE (PF) 100 MCG/2ML IJ SOLN
INTRAMUSCULAR | Status: DC | PRN
  Administered 2019-07-12: 100 ug via INTRAVENOUS

## 2019-07-12 MED ORDER — MIDAZOLAM HCL 2 MG/2ML IJ SOLN
INTRAMUSCULAR | Status: AC
  Filled 2019-07-12: qty 2

## 2019-07-12 MED ORDER — SODIUM CHLORIDE 0.9% FLUSH: 3.0000 mL | Freq: Once | INTRAVENOUS | Status: DC

## 2019-07-12 MED ORDER — PROPOFOL 10 MG/ML IV BOLUS
INTRAVENOUS | Status: AC
  Filled 2019-07-12: qty 20

## 2019-07-12 MED ORDER — SODIUM CHLORIDE 0.9 % IV BOLUS
500.0000 mL | Freq: Once | INTRAVENOUS | Status: AC
  Administered 2019-07-12: 09:00:00 500 mL via INTRAVENOUS

## 2019-07-12 MED ORDER — HYDROMORPHONE HCL 1 MG/ML IJ SOLN
0.5000 mg | INTRAMUSCULAR | Status: DC | PRN
  Administered 2019-07-12 (×2): 0.5 mg via INTRAVENOUS
  Filled 2019-07-12 (×2): qty 1

## 2019-07-12 MED ORDER — SODIUM ZIRCONIUM CYCLOSILICATE 10 G PO PACK
10.0000 g | PACK | Freq: Once | ORAL | Status: AC
  Administered 2019-07-12: 09:00:00 10 g via ORAL
  Filled 2019-07-12: qty 1

## 2019-07-12 MED ORDER — ESMOLOL HCL 100 MG/10ML IV SOLN
INTRAVENOUS | Status: AC
  Filled 2019-07-12: qty 10

## 2019-07-12 SURGICAL SUPPLY — 24 items
BAG URO CATCHER STRL LF (MISCELLANEOUS) ×3 IMPLANT
CATH INTERMIT  6FR 70CM (CATHETERS) IMPLANT
COVER WAND RF STERILE (DRAPES) ×3 IMPLANT
ELECT REM PT RETURN 9FT ADLT (ELECTROSURGICAL)
ELECTRODE REM PT RTRN 9FT ADLT (ELECTROSURGICAL) IMPLANT
FIBER LASER TRAC TIP (UROLOGICAL SUPPLIES) IMPLANT
GLOVE BIO SURGEON STRL SZ7.5 (GLOVE) ×3 IMPLANT
GOWN STRL REUS W/ TWL LRG LVL3 (GOWN DISPOSABLE) ×2 IMPLANT
GOWN STRL REUS W/TWL LRG LVL3 (GOWN DISPOSABLE) ×1
GUIDEWIRE ANG ZIPWIRE 038X150 (WIRE) ×1 IMPLANT
GUIDEWIRE STR DUAL SENSOR (WIRE) ×3 IMPLANT
IV NS 1000ML (IV SOLUTION) ×1
IV NS 1000ML BAXH (IV SOLUTION) ×2 IMPLANT
IV NS IRRIG 3000ML ARTHROMATIC (IV SOLUTION) ×4 IMPLANT
KIT TURNOVER KIT B (KITS) IMPLANT
MANIFOLD NEPTUNE II (INSTRUMENTS) ×4 IMPLANT
NS IRRIG 1000ML POUR BTL (IV SOLUTION) ×5 IMPLANT
PACK CYSTO (CUSTOM PROCEDURE TRAY) ×3 IMPLANT
SET IRRIG Y TYPE TUR BLADDER L (SET/KITS/TRAYS/PACK) ×1 IMPLANT
SOL PREP POV-IOD 4OZ 10% (MISCELLANEOUS) ×3 IMPLANT
STENT URET 6FRX24 CONTOUR (STENTS) ×2 IMPLANT
SYR 10ML LL (SYRINGE) ×3 IMPLANT
TUBE CONNECTING 12X1/4 (SUCTIONS) IMPLANT
TUBE FEEDING 8FR 16IN STR KANG (MISCELLANEOUS) IMPLANT

## 2019-07-12 NOTE — ED Provider Notes (Signed)
[facility] EMERGENCY DEPARTMENT Provider Note   CSN: [id number] Arrival date & time: 07/12/19  [id number]     History   Chief Complaint Chief Complaint  Patient presents with   Flank Pain    HPI Donald King is a 63 y.o. male.     HPI  63 year old male history of type 2 diabetes, hypothyroidism, presents today complaining of left flank pain that began suddenly last night at midnight.  Pain is severe and nonradiating.  He states that it is somewhat worse when he has standing up and somewhat relieved when laying down.  He has not taken any medication for this.  He denies any previous symptoms in the past.  Pain is 10 out of 10.  Denies fever, chills, urinary tract infection symptoms, hematuria, frequency of urination, diarrhea, or constipation. Review of records reveals that patient was released from incarceration of 4 years on July 16.  He states that he was told he had stage III kidney disease during that time.  However it is not documented in his chart.  He was seen at internal medicine clinic on July 27.  At that time he was started back on meloxicam for his chronic back pain.  Past Medical History:  Diagnosis Date   Diabetes mellitus without complication ([place])    Hypothyroidism     Patient Active Problem List   Diagnosis Date Noted   Hypertension 06/25/2019   HLD (hyperlipidemia) 03/22/2013   Erectile dysfunction 01/22/2013   DM (diabetes mellitus), type 2 ([place]) 12/27/2012   Hypothyroid 12/27/2012   Arthritis 12/27/2012    Past Surgical History:  Procedure Laterality Date   APPENDECTOMY  1966        Home Medications    Prior to Admission medications   Medication Sig Start Date End Date Taking? Authorizing Provider  diclofenac sodium (VOLTAREN) 1 % GEL Apply 2 g topically 4 (four) times daily. IM Program 06/24/19   Seawell, Andris Baumann A, DO  levothyroxine (SYNTHROID, LEVOTHROID) 112 MCG tablet TAKE ONE TABLET BY MOUTH ONCE DAILY 11/20/15    Jearld Fenton, NP  lisinopril (ZESTRIL) 10 MG tablet Take 1 tablet (10 mg total) by mouth daily. 06/25/19   Seawell, Jaimie A, DO  meloxicam (MOBIC) 7.5 MG tablet Take 1 tablet (7.5 mg total) by mouth daily. 06/24/19   Seawell, Jaimie A, DO  metFORMIN (GLUCOPHAGE) 500 MG tablet Take 1 tablet (500 mg total) by mouth daily with breakfast. 06/24/19 06/23/20  Seawell, Laurell Roof, DO    Family History Family History  Problem Relation Age of Onset   Stroke Mother    Heart disease Mother    Kidney disease Mother    Hypertension Mother    Diabetes Mother    Cancer Mother    Alcohol abuse Father     Social History Social History   Tobacco Use   Smoking status: Never Smoker   Smokeless tobacco: Never Used  Substance Use Topics   Alcohol use: No   Drug use: No    Comment: cocaine 20 years ago (since 2016)     Allergies   Patient has no known allergies.   Review of Systems Review of Systems  All other systems reviewed and are negative.    Physical Exam Updated Vital Signs BP (!) 168/86 (BP Location: Right Arm)    Pulse 65    Temp 98.5 F (36.9 C) (Oral)    Resp 14    SpO2 100%   Physical Exam Vitals signs reviewed.  Constitutional:      General: He is in acute distress.     Appearance: Normal appearance. He is not ill-appearing.  HENT:     Head: Normocephalic.     Right Ear: External ear normal.     Left Ear: External ear normal.     Nose: Nose normal.     Mouth/Throat:     Mouth: Mucous membranes are moist.  Eyes:     Pupils: Pupils are equal, round, and reactive to light.  Neck:     Musculoskeletal: Normal range of motion.  Cardiovascular:     Rate and Rhythm: Normal rate and regular rhythm.  Pulmonary:     Effort: Pulmonary effort is normal.     Breath sounds: Normal breath sounds.  Abdominal:     General: Abdomen is flat. Bowel sounds are normal.     Tenderness: There is abdominal tenderness.     Comments: Left midabdomen  Musculoskeletal: Normal  range of motion.  Skin:    General: Skin is warm and dry.     Capillary Refill: Capillary refill takes less than 2 seconds.  Neurological:     General: No focal deficit present.     Mental Status: He is alert. Mental status is at baseline.  Psychiatric:        Mood and Affect: Mood normal.        Behavior: Behavior normal.      ED Treatments / Results  Labs (all labs ordered are listed, but only abnormal results are displayed) Labs Reviewed  COMPREHENSIVE METABOLIC PANEL - Abnormal; Notable for the following components:      Result Value   Potassium 5.8 (*)    CO2 17 (*)    Glucose, Bld 227 (*)    Creatinine, Ser 3.87 (*)    Albumin 3.3 (*)    GFR calc non Af Amer 16 (*)    GFR calc Af Amer 18 (*)    All other components within normal limits  CBC - Abnormal; Notable for the following components:   RBC 3.14 (*)    Hemoglobin 9.3 (*)    HCT 29.7 (*)    All other components within normal limits  URINALYSIS, ROUTINE W REFLEX MICROSCOPIC - Abnormal; Notable for the following components:   Color, Urine STRAW (*)    Glucose, UA 150 (*)    Hgb urine dipstick MODERATE (*)    Bacteria, UA RARE (*)    All other components within normal limits  LIPASE, BLOOD    EKG EKG Interpretation  Date/Time:  Friday July 12 2019 08:20:13 EDT Ventricular Rate:  62 PR Interval:    QRS Duration: 83 QT Interval:  358 QTC Calculation: 364 R Axis:   50 Text Interpretation:  Sinus rhythm Although rate has decreased since last tracing Confirmed by Pattricia Boss [phone]) on 07/12/2019 9:37:05 AM   Radiology Ct Renal Stone Study  Result Date: 07/12/2019 CLINICAL DATA:  Left flank region pain EXAM: CT ABDOMEN AND PELVIS WITHOUT CONTRAST TECHNIQUE: Multidetector CT imaging of the abdomen and pelvis was performed following the standard protocol without oral or IV contrast. COMPARISON:  None. FINDINGS: Lower chest: There is slight bibasilar atelectasis. Lung bases otherwise are clear.  Hepatobiliary: There is a tiny calcified granuloma in the right lobe of the liver toward the dome. No other liver lesions are evident on this noncontrast enhanced study. Gallbladder wall is not appreciably thickened. There is no biliary duct dilatation. Pancreas: No pancreatic mass or inflammatory focus. Spleen: No  splenic lesions are evident. Adrenals/Urinary Tract: Adrenals bilaterally appear normal. Left kidney is edematous. There is a cyst in the anterior mid left kidney measuring 2.0 x 1.9 cm. There is mild to moderate hydronephrosis on the left. There is no hydronephrosis on the right. There are scattered calculi in the right kidney measuring in size from as small as 1 mm to as large as 4 mm. There is a 3 x 3 mm calculus in the lower pole left kidney. There is a 4 x 1 mm calculus in the mid left kidney with a nearby 2 mm calculus. There is a 1 mm calculus more superiorly on the left. There is a calculus in the proximal left ureter just beyond the ureteropelvic junction measuring 7 x 6 mm. No other ureteral calculi are evident on either side. Urinary bladder is midline with slight wall thickening along the posterior aspect. Remainder of the urinary bladder wall appears unremarkable on this noncontrast enhanced study. Stomach/Bowel: There is no appreciable bowel wall or mesenteric thickening. There is moderate stool in the colon. Terminal ileum appears normal. There is no evident bowel obstruction. No free air or portal venous air. Vascular/Lymphatic: There is no abdominal aortic aneurysm. There are foci of calcification in the distal aorta. No adenopathy is evident in the abdomen or pelvis. Reproductive: Prostate and seminal vesicles are normal in size and contour. There is no evident pelvic mass. Other: Appendix absent. No periappendiceal region inflammatory focus. No abscess or ascites is evident in the abdomen or pelvis. Musculoskeletal: There is degenerative change in the lumbar spine, most notably at L4-5.  There is avascular necrosis in the left femoral head without appreciable flattening. No blastic or lytic bone lesions. No intramuscular or abdominal wall lesions are evident. IMPRESSION: 1. 7 x 6 mm calculus in the proximal left ureter just beyond the left ureteropelvic junction. Mild to moderate hydronephrosis noted on the left. 2. Nonobstructing calculi in each kidney. 3. Mild thickening along the posterior urinary bladder wall which may represent a degree of cystitis. Most of the urinary bladder wall has normal thickness. 4. No bowel obstruction. No abscess in the abdomen pelvis. Appendix absent; no periappendiceal region inflammation. 5. Avascular necrosis in the left femoral head. No appreciable flattening of the left femoral head. Electronically Signed   By: Lowella Grip III M.D.   On: 07/12/2019 08:53    Procedures .Critical Care Performed by: Pattricia Boss, MD Authorized by: Pattricia Boss, MD   Critical care provider statement:    Critical care time (minutes):  45   Critical care was necessary to treat or prevent imminent or life-threatening deterioration of the following conditions:  Dehydration and renal failure   Critical care was time spent personally by me on the following activities:  Discussions with consultants, evaluation of patient's response to treatment, examination of patient, ordering and performing treatments and interventions, ordering and review of laboratory studies, ordering and review of radiographic studies, pulse oximetry, re-evaluation of patient's condition, obtaining history from patient or surrogate and review of old charts   (including critical care time)  Medications Ordered in ED Medications  sodium chloride flush (NS) 0.9 % injection 3 mL (has no administration in time range)     Initial Impression / Assessment and Plan / ED Course  I have reviewed the triage vital signs and the nursing notes.  Pertinent labs & imaging results that were available  during my care of the patient were reviewed by me and considered in my medical decision making (  see chart for details).   63 year old male presents today with left flank pain and on CT reveals obstructing stone in the mid left ureter with possible urinary tract infection.  Patient also somewhat hyperkalemic and has creatinine elevated at 3.87 with last creatinine creatinine was 1.29 in 2016.  He is also slightly anemic hemoglobin going from 12 in 20 16-9.3 now.  He has been imprisoned in the interim and states that he was told he had worsening renal function.  He is unaware of the changes in hemoglobin.  Potassium here was 5.8.  Is receiving IV hydration cation exchange and will have recheck potassium. 1 obstructing left renal stone discussed with Dr. Tammi Klippel and he states he will place stents this afternoon and requested patient remain n.p.o. 2- new kidney failure question acute upon chronic.  Patient receiving IV fluids and will need ongoing assessment 3 hyperkalemia potassium 5.8 patient is being hydrated and receiving education.  This will need ongoing monitoring monitoring.  Patient is on cardiac monitor and EKG done and appears stable at this time Discussed with Dr. Trilby Drummer, on-call for internal medicine teaching service and will see for admission Final Clinical Impressions(s) / ED Diagnoses   Final diagnoses:  Ureteral stone  Left ureteral stone  Hydronephrosis with urinary obstruction due to ureteral calculus  Hyperkalemia  Acute renal failure superimposed on chronic kidney disease, unspecified CKD stage, unspecified acute renal failure type [facility])    ED Discharge Orders    None       Pattricia Boss, MD 07/12/19 1142

## 2019-07-12 NOTE — Transfer of Care (Signed)
Immediate Anesthesia Transfer of Care Note  Patient: Donald King  Procedure(s) Performed: CYSTOSCOPY WITH RETROGRADE AND URETERAL STENT PLACEMENT (Bilateral Ureter) LEFT DIAGNOSTIC URETEROSCOPY (Left Ureter)  Patient Location: PACU  Anesthesia Type:General  Level of Consciousness: awake, alert  and patient cooperative  Airway & Oxygen Therapy: Patient Spontanous Breathing  Post-op Assessment: Report given to RN and Post -op Vital signs reviewed and stable  Post vital signs: Reviewed and stable  Last Vitals:  Vitals Value Taken Time  BP    Temp    Pulse    Resp    SpO2      Last Pain:  Vitals:   07/12/19 1714  TempSrc:   PainSc: 4          Complications: No apparent anesthesia complications

## 2019-07-12 NOTE — ED Triage Notes (Signed)
Pt c/o left sided flank pain since 1am.

## 2019-07-12 NOTE — ED Notes (Signed)
ED TO INPATIENT HANDOFF REPORT  ED Nurse Name and Phone #: Lorrin Goodell [phone]  S Name/Age/Gender Donald King 63 y.o. male Room/Bed: 028C/028C  Code Status   Code Status: Full Code  Home/SNF/Other Home Patient oriented to: self, place, time and situation Is this baseline? Yes   Triage Complete: Triage complete  Chief Complaint flank pain  Triage Note Pt c/o left sided flank pain since 1am.    Allergies No Known Allergies  Level of Care/Admitting Diagnosis ED Disposition    ED Disposition Condition Comment   Admit  Hospital Area: [facility] [[id number]]  Level of Care: Telemetry Medical [[id number]]  Covid Evaluation: Confirmed COVID Positive  Diagnosis: Nephrolithiasis [id number]  Admitting Physician: Bosie Helper  Attending Physician: Lucious Groves [[id number]]  Estimated length of stay: past midnight tomorrow  Certification:: I certify this patient will need inpatient services for at least 2 midnights  PT Class (Do Not Modify): Inpatient [101]  PT Acc Code (Do Not Modify): Private [1]       B Medical/Surgery History Past Medical History:  Diagnosis Date  . Diabetes mellitus without complication ([place])   . Hypothyroidism    Past Surgical History:  Procedure Laterality Date  . APPENDECTOMY  1966     A IV Location/Drains/Wounds Patient Lines/Drains/Airways Status   Active Line/Drains/Airways    Name:   Placement date:   Placement time:   Site:   Days:   Peripheral IV 07/12/19 Right Wrist   07/12/19    0929    Wrist   less than 1          Intake/Output Last 24 hours  Intake/Output Summary (Last 24 hours) at 07/12/2019 1410 Last data filed at 07/12/2019 1040 Gross per 24 hour  Intake 600 ml  Output -  Net 600 ml    Labs/Imaging Results for orders placed or performed during the hospital encounter of 07/12/19 (from the past 48 hour(s))  Urinalysis, Routine w reflex microscopic     Status: Abnormal   Collection Time: 07/12/19  6:38 AM   Result Value Ref Range   Color, Urine STRAW (A) YELLOW   APPearance CLEAR CLEAR   Specific Gravity, Urine 1.011 1.005 - 1.030   pH 5.0 5.0 - 8.0   Glucose, UA 150 (A) NEGATIVE mg/dL   Hgb urine dipstick MODERATE (A) NEGATIVE   Bilirubin Urine NEGATIVE NEGATIVE   Ketones, ur NEGATIVE NEGATIVE mg/dL   Protein, ur NEGATIVE NEGATIVE mg/dL   Nitrite NEGATIVE NEGATIVE   Leukocytes,Ua NEGATIVE NEGATIVE   RBC / HPF 11-20 0 - 5 RBC/hpf   WBC, UA 0-5 0 - 5 WBC/hpf   Bacteria, UA RARE (A) NONE SEEN   Mucus PRESENT     Comment: Performed at [facility], [address], [place], [place] [address]  Lipase, blood     Status: None   Collection Time: 07/12/19  6:41 AM  Result Value Ref Range   Lipase 51 11 - 51 U/L    Comment: Performed at [facility] [address], [place],  [address]  Comprehensive metabolic panel     Status: Abnormal   Collection Time: 07/12/19  6:41 AM  Result Value Ref Range   Sodium 136 135 - 145 mmol/L   Potassium 5.8 (H) 3.5 - 5.1 mmol/L   Chloride 110 98 - 111 mmol/L   CO2 17 (L) 22 - 32 mmol/L   Glucose, Bld 227 (H) 70 - 99 mg/dL   BUN 15  8 - 23 mg/dL   Creatinine, Ser 3.87 (H) 0.61 - 1.24 mg/dL   Calcium 9.0 8.9 - 10.3 mg/dL   Total Protein 7.4 6.5 - 8.1 g/dL   Albumin 3.3 (L) 3.5 - 5.0 g/dL   AST 20 15 - 41 U/L   ALT 19 0 - 44 U/L   Alkaline Phosphatase 79 38 - 126 U/L   Total Bilirubin 0.7 0.3 - 1.2 mg/dL   GFR calc non Af Amer 16 (L) >60 mL/min   GFR calc Af Amer 18 (L) >60 mL/min   Anion gap 9 5 - 15    Comment: Performed at [facility] [address], [place], [place] [address]  CBC     Status: Abnormal   Collection Time: 07/12/19  6:41 AM  Result Value Ref Range   WBC 5.3 4.0 - 10.5 K/uL   RBC 3.14 (L) 4.22 - 5.81 MIL/uL   Hemoglobin 9.3 (L) 13.0 - 17.0 g/dL   HCT 29.7 (L) 39.0 - 52.0 %   MCV 94.6 80.0 - 100.0 fL   MCH 29.6 26.0 - 34.0 pg   MCHC 31.3 30.0 - 36.0 g/dL   RDW 12.8 11.5 - 15.5 %   Platelets 211 150 -  400 K/uL   nRBC 0.0 0.0 - 0.2 %    Comment: Performed at [facility], [place] [address] [place], [place] [address]  Na and K (sodium & potassium), rand urine     Status: None   Collection Time: 07/12/19  8:14 AM  Result Value Ref Range   Sodium, Ur 105 mmol/L   Potassium Urine 39 mmol/L    Comment: Performed at [facility] [address] [place], [place] [address]  SARS Coronavirus 2 [facility] order, Performed in [facility] hospital lab) Nasopharyngeal Nasopharyngeal Swab     Status: Abnormal   Collection Time: 07/12/19  9:36 AM   Specimen: Nasopharyngeal Swab  Result Value Ref Range   SARS Coronavirus 2 POSITIVE (A) NEGATIVE    Comment: RESULT CALLED TO, READ BACK BY AND VERIFIED WITH: Providence Lanius RN 12:30 07/12/19 ([email or web address]) (NOTE) If result is NEGATIVE SARS-CoV-2 target nucleic acids are NOT DETECTED. The SARS-CoV-2 RNA is generally detectable in upper and lower  respiratory specimens during the acute phase of infection. The lowest  concentration of SARS-CoV-2 viral copies this assay can detect is 250  copies / mL. A negative result does not preclude SARS-CoV-2 infection  and should not be used as the sole basis for treatment or other  patient management decisions.  A negative result may occur with  improper specimen collection / handling, submission of specimen other  than nasopharyngeal swab, presence of viral mutation(s) within the  areas targeted by this assay, and inadequate number of viral copies  (<250 copies / mL). A negative result must be combined with clinical  observations, patient history, and epidemiological information. If result is POSITIVE SARS-CoV-2 target nucleic acids are DETECTED. T he SARS-CoV-2 RNA is generally detectable in upper and lower  respiratory specimens during the acute phase of infection.  Positive  results are indicative of active infection with SARS-CoV-2.  Clinical  correlation with patient history and other diagnostic information  is  necessary to determine patient infection status.  Positive results do  not rule out bacterial infection or co-infection with other viruses. If result is PRESUMPTIVE POSTIVE SARS-CoV-2 nucleic acids MAY BE PRESENT.   A presumptive positive result was obtained on the submitted specimen  and  confirmed on repeat testing.  While 2019 novel coronavirus  (SARS-CoV-2) nucleic acids may be present in the submitted sample  additional confirmatory testing may be necessary for epidemiological  and / or clinical management purposes  to differentiate between  SARS-CoV-2 and other Sarbecovirus currently known to infect humans.  If clinically indicated additional testing with an alternate test  methodology [phone]) is  advised. The SARS-CoV-2 RNA is generally  detectable in upper and lower respiratory specimens during the acute  phase of infection. The expected result is Negative. Fact Sheet for Patients:  StrictlyIdeas.no Fact Sheet for Healthcare Providers: BankingDealers.co.za This test is not yet approved or cleared by the [place] FDA and has been authorized for detection and/or diagnosis of SARS-CoV-2 by FDA under an Emergency Use Authorization (EUA).  This EUA will remain in effect (meaning this test can be used) for the duration of the COVID-19 declaration under Section 564(b)(1) of the Act, 21 U.S.C. section 360bbb-3(b)(1), unless the authorization is terminated or revoked sooner. Performed at [facility], [place] [address] [place], [place] [address]   Potassium     Status: Abnormal   Collection Time: 07/12/19 10:32 AM  Result Value Ref Range   Potassium 6.3 (HH) 3.5 - 5.1 mmol/L    Comment: CRITICAL RESULT CALLED TO, READ BACK BY AND VERIFIED WITH: K.COBB,RN 1144 07/12/2019 CLARK,S Performed at [facility], [place] [address], [place], [place] [address]   CBG monitoring, ED     Status: Abnormal   Collection Time: 07/12/19  11:52 AM  Result Value Ref Range   Glucose-Capillary 173 (H) 70 - 99 mg/dL  Potassium     Status: Abnormal   Collection Time: 07/12/19 12:50 PM  Result Value Ref Range   Potassium 6.2 (H) 3.5 - 5.1 mmol/L    Comment: Performed at [facility], [place] [address], [place], [place] [address]   Ct Renal Laren Everts  Addendum Date: 07/12/2019   ADDENDUM REPORT: 07/12/2019 09:58 ADDENDUM: Add to IMPRESSION: There is localized thickening along the posterior aspect of the urinary bladder wall. Question focal cystitis in this area. No well-defined mass by CT evident in this region. This finding potentially may warrant direct visualization of the bladder given the focal nature of the finding. Electronically Signed   By: Lowella Grip III M.D.   On: 07/12/2019 09:58   Result Date: 07/12/2019 CLINICAL DATA:  Left flank region pain EXAM: CT ABDOMEN AND PELVIS WITHOUT CONTRAST TECHNIQUE: Multidetector CT imaging of the abdomen and pelvis was performed following the standard protocol without oral or IV contrast. COMPARISON:  None. FINDINGS: Lower chest: There is slight bibasilar atelectasis. Lung bases otherwise are clear. Hepatobiliary: There is a tiny calcified granuloma in the right lobe of the liver toward the dome. No other liver lesions are evident on this noncontrast enhanced study. Gallbladder wall is not appreciably thickened. There is no biliary duct dilatation. Pancreas: No pancreatic mass or inflammatory focus. Spleen: No splenic lesions are evident. Adrenals/Urinary Tract: Adrenals bilaterally appear normal. Left kidney is edematous. There is a cyst in the anterior mid left kidney measuring 2.0 x 1.9 cm. There is mild to moderate hydronephrosis on the left. There is no hydronephrosis on the right. There are scattered calculi in the right kidney measuring in size from as small as 1 mm to as large as 4 mm. There is a 3 x 3 mm calculus in the lower pole left kidney. There is a 4 x 1 mm calculus in  the  mid left kidney with a nearby 2 mm calculus. There is a 1 mm calculus more superiorly on the left. There is a calculus in the proximal left ureter just beyond the ureteropelvic junction measuring 7 x 6 mm. No other ureteral calculi are evident on either side. Urinary bladder is midline with slight wall thickening along the posterior aspect. Remainder of the urinary bladder wall appears unremarkable on this noncontrast enhanced study. Stomach/Bowel: There is no appreciable bowel wall or mesenteric thickening. There is moderate stool in the colon. Terminal ileum appears normal. There is no evident bowel obstruction. No free air or portal venous air. Vascular/Lymphatic: There is no abdominal aortic aneurysm. There are foci of calcification in the distal aorta. No adenopathy is evident in the abdomen or pelvis. Reproductive: Prostate and seminal vesicles are normal in size and contour. There is no evident pelvic mass. Other: Appendix absent. No periappendiceal region inflammatory focus. No abscess or ascites is evident in the abdomen or pelvis. Musculoskeletal: There is degenerative change in the lumbar spine, most notably at L4-5. There is avascular necrosis in the left femoral head without appreciable flattening. No blastic or lytic bone lesions. No intramuscular or abdominal wall lesions are evident. IMPRESSION: 1. 7 x 6 mm calculus in the proximal left ureter just beyond the left ureteropelvic junction. Mild to moderate hydronephrosis noted on the left. 2. Nonobstructing calculi in each kidney. 3. Mild thickening along the posterior urinary bladder wall which may represent a degree of cystitis. Most of the urinary bladder wall has normal thickness. 4. No bowel obstruction. No abscess in the abdomen pelvis. Appendix absent; no periappendiceal region inflammation. 5. Avascular necrosis in the left femoral head. No appreciable flattening of the left femoral head. Electronically Signed: By: Lowella Grip III M.D.  On: 07/12/2019 08:53    Pending Labs Unresulted Labs (From admission, onward)    Start     Ordered   07/13/19 [id number]  Basic metabolic panel  Tomorrow morning,   R     07/12/19 1106   07/13/19 0500  CBC  Tomorrow morning,   R     07/12/19 1106   07/12/19 1107  HIV antibody (Routine Testing)  Once,   STAT     07/12/19 1106   07/12/19 0942  Urine Culture  Once,   STAT     07/12/19 0941   07/12/19 0814  Potassium  Now then every 4 hours,   STAT    Comments: Until normal twice.    07/12/19 0814          Vitals/Pain Today's Vitals   07/12/19 1230 07/12/19 1300 07/12/19 1330 07/12/19 1343  BP: 124/72 133/75 131/83   Pulse: 72 83 82   Resp: 20 (!) 24 (!) 23   Temp:      TempSrc:      SpO2: 100%  100%   Weight:      Height:      PainSc:    5     Isolation Precautions Airborne and Contact precautions  Medications Medications  sodium chloride flush (NS) 0.9 % injection 3 mL (3 mLs Intravenous Not Given 07/12/19 0930)  acetaminophen (TYLENOL) tablet 650 mg (has no administration in time range)    Or  acetaminophen (TYLENOL) suppository 650 mg (has no administration in time range)  polyethylene glycol (MIRALAX / GLYCOLAX) packet 17 g (has no administration in time range)  levothyroxine (SYNTHROID) tablet 112 mcg (112 mcg Oral Given 07/12/19 1204)  HYDROmorphone (DILAUDID) injection 0.5  mg (0.5 mg Intravenous Given 07/12/19 1203)  0.9 %  sodium chloride infusion ( Intravenous New Bag/Given 07/12/19 1206)  insulin aspart (novoLOG) injection 0-9 Units (2 Units Subcutaneous Given 07/12/19 1203)  sodium zirconium cyclosilicate (LOKELMA) packet 10 g (has no administration in time range)  sodium chloride 0.9 % bolus 500 mL (0 mLs Intravenous Stopped 07/12/19 1040)  sodium zirconium cyclosilicate (LOKELMA) packet 10 g (10 g Oral Given 07/12/19 0916)  cefTRIAXone (ROCEPHIN) 1 g in sodium chloride 0.9 % 100 mL IVPB (0 g Intravenous Stopped 07/12/19 1023)  morphine 2 MG/ML injection 2 mg (2 mg  Intravenous Given 07/12/19 0947)    Mobility walks Low fall risk   Focused Assessments Renal Assessment Handoff:         R Recommendations: See Admitting Provider Note  Report given to:   Additional Notes:

## 2019-07-12 NOTE — ED Notes (Signed)
[name], RN given report on 2W

## 2019-07-12 NOTE — Anesthesia Preprocedure Evaluation (Addendum)
Anesthesia Evaluation  Patient identified by MRN, date of birth, ID band Patient awake    Reviewed: Allergy & Precautions, NPO status , Patient's Chart, lab work & pertinent test results  Airway Mallampati: II  TM Distance: >3 FB Neck ROM: Full    Dental  (+) Teeth Intact, Dental Advisory Given   Pulmonary neg pulmonary ROS,    breath sounds clear to auscultation       Cardiovascular hypertension, Pt. on medications negative cardio ROS   Rhythm:Regular Rate:Normal     Neuro/Psych negative neurological ROS  negative psych ROS   GI/Hepatic negative GI ROS, Neg liver ROS,   Endo/Other  diabetes, Type 2, Oral Hypoglycemic AgentsHypothyroidism   Renal/GU ARFRenal disease  negative genitourinary   Musculoskeletal  (+) Arthritis , Osteoarthritis,    Abdominal   Peds negative pediatric ROS (+)  Hematology negative hematology ROS (+)   Anesthesia Other Findings   Reproductive/Obstetrics negative OB ROS                            Anesthesia Physical Anesthesia Plan  ASA: II  Anesthesia Plan: General   Post-op Pain Management:    Induction: Intravenous  PONV Risk Score and Plan: 2 and Ondansetron and Treatment may vary due to age or medical condition  Airway Management Planned: Oral ETT  Additional Equipment:   Intra-op Plan:   Post-operative Plan: Extubation in OR  Informed Consent: I have reviewed the patients History and Physical, chart, labs and discussed the procedure including the risks, benefits and alternatives for the proposed anesthesia with the patient or authorized representative who has indicated his/her understanding and acceptance.     Dental advisory given  Plan Discussed with: CRNA  Anesthesia Plan Comments: (Covid +. Full PPE)        Anesthesia Quick Evaluation

## 2019-07-12 NOTE — Consult Note (Signed)
Reason for Consult: Left Ureteral / Right Renal Stones, Acute on Chronic Renal Failure, Prostate Screening  Referring Physician: Pattricia Boss MD  Donald King is an 63 y.o. male.   HPI:   1 - Left Ureteral / Right Renal Stones - 72mm left prox ureteral stone with mild hydro, Rt lower pole non-obstructing stone abtou 71mm on ER CT 06/2019 on eval left flank pain. UA without infectious parameters. NO fevers / tachycardia.   2 - Acute on Chronic Renal Failure - Cr 3.8 with K 5.8 by ER labs, unilateral left  Hydro. He is variably compliant diabetic. Last Cr on record 1.5 2016.   3 -  Prostate Screening - No FHX prostate cancer  PMH sig for [place], DM2. No CV disease / blood thinners.   Today "Donald King" is seen as urgent consult for above. C19 screen positive, no pulm symptoms.   Past Medical History:  Diagnosis Date  . Diabetes mellitus without complication ([place])   . Hypothyroidism     Past Surgical History:  Procedure Laterality Date  . APPENDECTOMY  1966    Family History  Problem Relation Age of Onset  . Stroke Mother   . Heart disease Mother   . Kidney disease Mother   . Hypertension Mother   . Diabetes Mother   . Cancer Mother   . Alcohol abuse Father     Social History:  reports that he has never smoked. He has never used smokeless tobacco. He reports that he does not drink alcohol or use drugs.  Allergies: No Known Allergies  Medications: I have reviewed the patient's current medications.  Results for orders placed or performed during the hospital encounter of 07/12/19 (from the past 48 hour(s))  Urinalysis, Routine w reflex microscopic     Status: Abnormal   Collection Time: 07/12/19  6:38 AM  Result Value Ref Range   Color, Urine STRAW (A) YELLOW   APPearance CLEAR CLEAR   Specific Gravity, Urine 1.011 1.005 - 1.030   pH 5.0 5.0 - 8.0   Glucose, UA 150 (A) NEGATIVE mg/dL   Hgb urine dipstick MODERATE (A) NEGATIVE   Bilirubin Urine NEGATIVE NEGATIVE   Ketones, ur  NEGATIVE NEGATIVE mg/dL   Protein, ur NEGATIVE NEGATIVE mg/dL   Nitrite NEGATIVE NEGATIVE   Leukocytes,Ua NEGATIVE NEGATIVE   RBC / HPF 11-20 0 - 5 RBC/hpf   WBC, UA 0-5 0 - 5 WBC/hpf   Bacteria, UA RARE (A) NONE SEEN   Mucus PRESENT     Comment: Performed at [facility], [address], [place], [place] [address]  Lipase, blood     Status: None   Collection Time: 07/12/19  6:41 AM  Result Value Ref Range   Lipase 51 11 - 51 U/L    Comment: Performed at [facility] [address], [place],  [address]  Comprehensive metabolic panel     Status: Abnormal   Collection Time: 07/12/19  6:41 AM  Result Value Ref Range   Sodium 136 135 - 145 mmol/L   Potassium 5.8 (H) 3.5 - 5.1 mmol/L   Chloride 110 98 - 111 mmol/L   CO2 17 (L) 22 - 32 mmol/L   Glucose, Bld 227 (H) 70 - 99 mg/dL   BUN 15 8 - 23 mg/dL   Creatinine, Ser 3.87 (H) 0.61 - 1.24 mg/dL   Calcium 9.0 8.9 - 10.3 mg/dL   Total Protein 7.4 6.5 - 8.1 g/dL   Albumin 3.3 (L) 3.5 -  5.0 g/dL   AST 20 15 - 41 U/L   ALT 19 0 - 44 U/L   Alkaline Phosphatase 79 38 - 126 U/L   Total Bilirubin 0.7 0.3 - 1.2 mg/dL   GFR calc non Af Amer 16 (L) >60 mL/min   GFR calc Af Amer 18 (L) >60 mL/min   Anion gap 9 5 - 15    Comment: Performed at [facility] [address] [place], [place] [address]  CBC     Status: Abnormal   Collection Time: 07/12/19  6:41 AM  Result Value Ref Range   WBC 5.3 4.0 - 10.5 K/uL   RBC 3.14 (L) 4.22 - 5.81 MIL/uL   Hemoglobin 9.3 (L) 13.0 - 17.0 g/dL   HCT 29.7 (L) 39.0 - 52.0 %   MCV 94.6 80.0 - 100.0 fL   MCH 29.6 26.0 - 34.0 pg   MCHC 31.3 30.0 - 36.0 g/dL   RDW 12.8 11.5 - 15.5 %   Platelets 211 150 - 400 K/uL   nRBC 0.0 0.0 - 0.2 %    Comment: Performed at [facility], [place] [address] [place], [place] [address]  Na and K (sodium & potassium), rand urine     Status: None   Collection Time: 07/12/19  8:14 AM  Result Value Ref Range   Sodium, Ur 105 mmol/L   Potassium  Urine 39 mmol/L    Comment: Performed at [facility] [address] [place], [place] [address]    Ct Renal Stone Study  Result Date: 07/12/2019 CLINICAL DATA:  Left flank region pain EXAM: CT ABDOMEN AND PELVIS WITHOUT CONTRAST TECHNIQUE: Multidetector CT imaging of the abdomen and pelvis was performed following the standard protocol without oral or IV contrast. COMPARISON:  None. FINDINGS: Lower chest: There is slight bibasilar atelectasis. Lung bases otherwise are clear. Hepatobiliary: There is a tiny calcified granuloma in the right lobe of the liver toward the dome. No other liver lesions are evident on this noncontrast enhanced study. Gallbladder wall is not appreciably thickened. There is no biliary duct dilatation. Pancreas: No pancreatic mass or inflammatory focus. Spleen: No splenic lesions are evident. Adrenals/Urinary Tract: Adrenals bilaterally appear normal. Left kidney is edematous. There is a cyst in the anterior mid left kidney measuring 2.0 x 1.9 cm. There is mild to moderate hydronephrosis on the left. There is no hydronephrosis on the right. There are scattered calculi in the right kidney measuring in size from as small as 1 mm to as large as 4 mm. There is a 3 x 3 mm calculus in the lower pole left kidney. There is a 4 x 1 mm calculus in the mid left kidney with a nearby 2 mm calculus. There is a 1 mm calculus more superiorly on the left. There is a calculus in the proximal left ureter just beyond the ureteropelvic junction measuring 7 x 6 mm. No other ureteral calculi are evident on either side. Urinary bladder is midline with slight wall thickening along the posterior aspect. Remainder of the urinary bladder wall appears unremarkable on this noncontrast enhanced study. Stomach/Bowel: There is no appreciable bowel wall or mesenteric thickening. There is moderate stool in the colon. Terminal ileum appears normal. There is no evident bowel obstruction. No free air or portal venous  air. Vascular/Lymphatic: There is no abdominal aortic aneurysm. There are foci of calcification in the distal aorta. No adenopathy is evident in the abdomen or pelvis. Reproductive: Prostate and seminal  vesicles are normal in size and contour. There is no evident pelvic mass. Other: Appendix absent. No periappendiceal region inflammatory focus. No abscess or ascites is evident in the abdomen or pelvis. Musculoskeletal: There is degenerative change in the lumbar spine, most notably at L4-5. There is avascular necrosis in the left femoral head without appreciable flattening. No blastic or lytic bone lesions. No intramuscular or abdominal wall lesions are evident. IMPRESSION: 1. 7 x 6 mm calculus in the proximal left ureter just beyond the left ureteropelvic junction. Mild to moderate hydronephrosis noted on the left. 2. Nonobstructing calculi in each kidney. 3. Mild thickening along the posterior urinary bladder wall which may represent a degree of cystitis. Most of the urinary bladder wall has normal thickness. 4. No bowel obstruction. No abscess in the abdomen pelvis. Appendix absent; no periappendiceal region inflammation. 5. Avascular necrosis in the left femoral head. No appreciable flattening of the left femoral head. Electronically Signed   By: Lowella Grip III M.D.   On: 07/12/2019 08:53    Review of Systems  Constitutional: Positive for malaise/fatigue. Negative for chills and fever.  Genitourinary: Positive for flank pain.  All other systems reviewed and are negative.  Blood pressure (!) 153/87, pulse 64, temperature 98.5 F (36.9 C), temperature source Oral, resp. rate 20, height 5\' 6"  (1.676 m), weight 69 kg, SpO2 100 %. Physical Exam  Constitutional: He appears well-developed.  Very pleasant and cooperative in [place] isolation room.   HENT:  Head: Normocephalic.  Eyes: Pupils are equal, round, and reactive to light.  Neck: Normal range of motion.  Cardiovascular: Normal rate.   Respiratory: Effort normal.  GI: Soft.  Genitourinary:    Genitourinary Comments: Mild left CVAT at present.    Musculoskeletal: Normal range of motion.  Neurological: He is alert.  Skin: Skin is warm.  Psychiatric: He has a normal mood and affect.    Assessment/Plan:  1 - Left Ureteral / Right Renal Stones - rec bilateral stents today for renal decompression and allow som erenal recovery then bilateral ureteroscopy in several weeks in elective setting for goal of stone free as optimal path. Risks, benefits, alternatives, expected peri-op course discussed. Will arrange for this afternoon as MC.   2 - Acute on Chronic Renal Failure - likely from unilateral obsturction in setting of baseline medical renal disease. This will likelky partially resolve with renal decompression and hydration  3 -  Prostate Screening - PSA on follow up.   Appreciate internal medicine team comangement. Please call me directly with questions anytime.   Alexis Frock 07/12/2019, 9:54 AM

## 2019-07-12 NOTE — ED Notes (Signed)
Patient transported to CT 

## 2019-07-12 NOTE — H&P (Addendum)
Date: 07/12/2019               Patient Name:  Donald King MRN: [id number]  DOB: 1956-08-23 Age / Sex: 63 y.o., male   PCP: Patient, No Pcp Per         Medical Service: Internal Medicine Teaching Service         Attending Physician: Dr. Lucious Groves, DO    First Contact: Dr. Albertine Grates Pager: [phone]  Second Contact: Dr. Tarri Abernethy Pager: [phone]       After Hours (After 5p/  First Contact Pager: [phone]  weekends / holidays): Second Contact Pager: [phone]   Chief Complaint: Left Flank Pain  History of Present Illness: Donald King is a 63 yo M with Hx of DM, Hypothyroidism, HTN, ?CKD3 and HLD who presented with 1 day of severe left flank pain. He states he has had ongoing flank pain for at least 6 months that would occur about 2 times per week and last for about 30 minutes at at time. The pain was typically about 7-8/10. Starting at [place] last night he began to experience constant 10/10 flank pain that has not subsided. The pain is described as a sharp pain that intermittently radiates to his L inguinal region. He has not noting anything that alleviates his pain and has noted that certain positions or movements worsen his pain. He denies fevers, chills, nausea, vomiting, constipation, diarrhea, or changes in urine appearance nor urinary habits.  ED workup significant for Cr 3.8 (baseline unclear reportedly CKD3), K 5.8, Hgb 9.3, U/A with Hgb and trace bacteria, CT w/ 7x22mm stone at L UPJ and L hydronephrosis.  Meds:  Current Meds  Medication Sig  . levothyroxine (SYNTHROID, LEVOTHROID) 112 MCG tablet TAKE ONE TABLET BY MOUTH ONCE DAILY  . lisinopril (ZESTRIL) 10 MG tablet Take 1 tablet (10 mg total) by mouth daily.  . metFORMIN (GLUCOPHAGE) 500 MG tablet Take 1 tablet (500 mg total) by mouth daily with breakfast.     Allergies: Allergies as of 07/12/2019  . (No Known Allergies)   Past Medical History:  Diagnosis Date  . Diabetes mellitus without complication ([place])   .  Hypothyroidism     Family History:  Family History  Problem Relation Age of Onset  . Stroke Mother   . Heart disease Mother   . Kidney disease Mother   . Hypertension Mother   . Diabetes Mother   . Cancer Mother   . Alcohol abuse Father   Reviewed on admission  Social History:  Social History   Tobacco Use  . Smoking status: Never Smoker  . Smokeless tobacco: Never Used  Substance Use Topics  . Alcohol use: No  . Drug use: No    Comment: cocaine 20 years ago (since 2016)  Reviewed on admisson  Review of Systems: A complete ROS was negative except as per HPI.  Physical Exam: Blood pressure (!) 147/76, pulse 70, temperature 98.5 F (36.9 C), temperature source Oral, resp. rate (!) 23, height 5\' 6"  (1.676 m), weight 69 kg, SpO2 99 %. Physical Exam Constitutional:      Appearance: Normal appearance.     Comments: Mild distress  HENT:     Head: Normocephalic and atraumatic.  Cardiovascular:     Rate and Rhythm: Normal rate and regular rhythm.     Pulses: Normal pulses.     Heart sounds: Normal heart sounds.  Pulmonary:     Effort: Pulmonary effort is normal. No respiratory distress.  Breath sounds: Normal breath sounds.  Abdominal:     General: Bowel sounds are normal. There is no distension.     Palpations: Abdomen is soft.     Tenderness: There is abdominal tenderness. There is left CVA tenderness.     Comments: Left flank tenderness  Musculoskeletal:        General: No swelling or deformity.  Skin:    General: Skin is warm and dry.  Neurological:     General: No focal deficit present.     Mental Status: Mental status is at baseline.  Psychiatric:        Mood and Affect: Mood normal.        Behavior: Behavior normal.    EKG: personally reviewed my interpretation is sinus rhythm  CT Renal Stone Study: IMPRESSION: 1. 7 x 6 mm calculus in the proximal left ureter just beyond the left ureteropelvic junction. Mild to moderate hydronephrosis noted on the  left. 2. Nonobstructing calculi in each kidney. 3. Mild thickening along the posterior urinary bladder wall which may represent a degree of cystitis. Most of the urinary bladder wall has normal thickness. 4. No bowel obstruction. No abscess in the abdomen pelvis. Appendix absent; no periappendiceal region inflammation. 5. Avascular necrosis in the left femoral head. No appreciable flattening of the left femoral head.  Assessment & Plan by Problem: Active Problems:   Nephrolithiasis  Nephrolithiasis: Likely history of intermittently obstructing stone given history of intermittent flank pain. Persistent pain starting this AM with stone now lodged at L UPJ. Urology discussed patient with EDP and the plan is for him to go for L ureteral stenting later today. Received dose of Ceftriaxone in ED due to bladder wall thicken on CT, will hold off on further treatment given lack of symptoms. - Appreciate Urology recommendations - NS 125cc/hr - Dailauded 0,5mg  IV, q4h PRN  A/CKD3 Hyperkalemia: No recent baseline renal function as patient was recent released from prison. He reports he was told he has CKD 3. Cr 3.8 in ED with K 5.8, given a dose of Lokelma in the ED. - Lokelma TID - Serial K monitoring - AM CMP  COVID-19 ADDENDUM: Covid test cam back positive, patient is asymtpomatic  HTN - Hold home lisinopril 2/2 A/CKD  Hypothyroid - Continue home Synthroid 159mcg Daily after surgery  Diabetes - Hold Metformin - SSI q4h  FEN: NS 125cc/hr, replete lytes prn, NPO VTE ppx: SCDs Code Status: FULL   Dispo: Admit patient to Inpatient with expected length of stay greater than 2 midnights.  Signed: Neva Seat, MD 07/12/2019, 11:57 AM  Pager: [phone]

## 2019-07-12 NOTE — ED Notes (Signed)
Notified Dr. Heber Hemlock of pt's potassium level.

## 2019-07-12 NOTE — ED Notes (Signed)
Pt requesting pain medication.  

## 2019-07-12 NOTE — Brief Op Note (Signed)
07/12/2019  6:35 PM  PATIENT:  Donald King  63 y.o. male  PRE-OPERATIVE DIAGNOSIS:  Acute renal failure  POST-OPERATIVE DIAGNOSIS:  Acute renal failure  PROCEDURE:  Procedure(s): CYSTOSCOPY WITH RETROGRADE AND URETERAL STENT PLACEMENT (Bilateral) LEFT DIAGNOSTIC URETEROSCOPY (Left)  SURGEON:  Surgeon(s) and Role:    Alexis Frock, MD - Primary  PHYSICIAN ASSISTANT:   ASSISTANTS: none   ANESTHESIA:   general  EBL:  minimal   BLOOD ADMINISTERED:none  DRAINS: foley to gravity   LOCAL MEDICATIONS USED:  NONE  SPECIMEN:  No Specimen  DISPOSITION OF SPECIMEN:  N/A  COUNTS:  YES  TOURNIQUET:  * No tourniquets in log *  DICTATION: .Other Dictation: Dictation Number [id number]  PLAN OF CARE: Admit to inpatient   PATIENT DISPOSITION:  remain in OR for initial recovery   Delay start of Pharmacological VTE agent (>24hrs) due to surgical blood loss or risk of bleeding: yes

## 2019-07-12 NOTE — Anesthesia Postprocedure Evaluation (Signed)
Anesthesia Post Note  Patient: Donald King  Procedure(s) Performed: CYSTOSCOPY WITH RETROGRADE AND URETERAL STENT PLACEMENT (Bilateral Ureter) LEFT DIAGNOSTIC URETEROSCOPY (Left Ureter)     Patient location during evaluation: PACU Anesthesia Type: General Level of consciousness: awake and alert Pain management: pain level controlled Vital Signs Assessment: post-procedure vital signs reviewed and stable Respiratory status: spontaneous breathing, nonlabored ventilation and respiratory function stable Cardiovascular status: blood pressure returned to baseline and stable Postop Assessment: no apparent nausea or vomiting Anesthetic complications: no    Last Vitals:  Vitals:   07/12/19 1700 07/12/19 1850  BP: 120/82 134/78  Pulse:    Resp:  18  Temp: 37.2 C   SpO2: 97% 98%    Last Pain:  Vitals:   07/12/19 1850  TempSrc:   PainSc: 0-No pain                 Audry Pili

## 2019-07-12 NOTE — Anesthesia Procedure Notes (Signed)
Procedure Name: Intubation Date/Time: 07/12/2019 5:45 PM Performed by: Cleda Daub, CRNA Pre-anesthesia Checklist: Patient identified, Emergency Drugs available, Suction available and Patient being monitored Patient Re-evaluated:Patient Re-evaluated prior to induction Oxygen Delivery Method: Circle system utilized Preoxygenation: Pre-oxygenation with 100% oxygen Induction Type: IV induction Laryngoscope Size: Glidescope and 3 Grade View: Grade I Tube type: Oral Tube size: 7.5 mm Number of attempts: 1 Airway Equipment and Method: Video-laryngoscopy Placement Confirmation: positive ETCO2 and breath sounds checked- equal and bilateral Secured at: 23 cm Tube secured with: Tape Dental Injury: Teeth and Oropharynx as per pre-operative assessment

## 2019-07-12 NOTE — ED Notes (Signed)
Pt is NSR on monitor 

## 2019-07-13 ENCOUNTER — Encounter (HOSPITAL_COMMUNITY): Payer: Self-pay | Admitting: Urology

## 2019-07-13 DIAGNOSIS — N132 Hydronephrosis with renal and ureteral calculous obstruction: Secondary | ICD-10-CM | POA: Diagnosis not present

## 2019-07-13 DIAGNOSIS — I129 Hypertensive chronic kidney disease with stage 1 through stage 4 chronic kidney disease, or unspecified chronic kidney disease: Secondary | ICD-10-CM

## 2019-07-13 DIAGNOSIS — E875 Hyperkalemia: Secondary | ICD-10-CM

## 2019-07-13 DIAGNOSIS — E1122 Type 2 diabetes mellitus with diabetic chronic kidney disease: Secondary | ICD-10-CM

## 2019-07-13 DIAGNOSIS — Z960 Presence of urogenital implants: Secondary | ICD-10-CM

## 2019-07-13 DIAGNOSIS — E785 Hyperlipidemia, unspecified: Secondary | ICD-10-CM

## 2019-07-13 DIAGNOSIS — U071 COVID-19: Secondary | ICD-10-CM | POA: Diagnosis not present

## 2019-07-13 DIAGNOSIS — E872 Acidosis: Secondary | ICD-10-CM

## 2019-07-13 DIAGNOSIS — E039 Hypothyroidism, unspecified: Secondary | ICD-10-CM

## 2019-07-13 DIAGNOSIS — Z79890 Hormone replacement therapy (postmenopausal): Secondary | ICD-10-CM

## 2019-07-13 DIAGNOSIS — Z7984 Long term (current) use of oral hypoglycemic drugs: Secondary | ICD-10-CM

## 2019-07-13 DIAGNOSIS — N183 Chronic kidney disease, stage 3 (moderate): Secondary | ICD-10-CM

## 2019-07-13 DIAGNOSIS — N179 Acute kidney failure, unspecified: Secondary | ICD-10-CM

## 2019-07-13 DIAGNOSIS — Z79899 Other long term (current) drug therapy: Secondary | ICD-10-CM

## 2019-07-13 LAB — BASIC METABOLIC PANEL
Anion gap: 7 (ref 5–15)
Anion gap: 10 (ref 5–15)
BUN: 17 mg/dL (ref 8–23)
BUN: 17 mg/dL (ref 8–23)
CO2: 17 mmol/L — ABNORMAL LOW (ref 22–32)
CO2: 19 mmol/L — ABNORMAL LOW (ref 22–32)
Calcium: 8.1 mg/dL — ABNORMAL LOW (ref 8.9–10.3)
Calcium: 8.1 mg/dL — ABNORMAL LOW (ref 8.9–10.3)
Chloride: 113 mmol/L — ABNORMAL HIGH (ref 98–111)
Chloride: 109 mmol/L (ref 98–111)
Creatinine, Ser: 3.7 mg/dL — ABNORMAL HIGH (ref 0.61–1.24)
Creatinine, Ser: 3.68 mg/dL — ABNORMAL HIGH (ref 0.61–1.24)
GFR calc Af Amer: 19 mL/min — ABNORMAL LOW (ref >60)
GFR calc Af Amer: 19 mL/min — ABNORMAL LOW (ref >60)
GFR calc non Af Amer: 16 mL/min — ABNORMAL LOW (ref >60)
GFR calc non Af Amer: 17 mL/min — ABNORMAL LOW (ref >60)
Glucose, Bld: 89 mg/dL (ref 70–99)
Glucose, Bld: 94 mg/dL (ref 70–99)
Potassium: 4.8 mmol/L (ref 3.5–5.1)
Potassium: 4.6 mmol/L (ref 3.5–5.1)
Sodium: 137 mmol/L (ref 135–145)
Sodium: 138 mmol/L (ref 135–145)

## 2019-07-13 LAB — HIV ANTIBODY (ROUTINE TESTING W REFLEX): HIV Screen 4th Generation wRfx: NONREACTIVE

## 2019-07-13 LAB — CBC
HCT: 24.9 % — ABNORMAL LOW (ref 39.0–52.0)
Hemoglobin: 8.1 g/dL — ABNORMAL LOW (ref 13.0–17.0)
MCH: 29.9 pg (ref 26.0–34.0)
MCHC: 32.5 g/dL (ref 30.0–36.0)
MCV: 91.9 fL (ref 80.0–100.0)
Platelets: 164 10*3/uL (ref 150–400)
RBC: 2.71 MIL/uL — ABNORMAL LOW (ref 4.22–5.81)
RDW: 12.7 % (ref 11.5–15.5)
WBC: 3.3 10*3/uL — ABNORMAL LOW (ref 4.0–10.5)
nRBC: 0 % (ref 0.0–0.2)

## 2019-07-13 LAB — GLUCOSE, CAPILLARY
Glucose-Capillary: 155 mg/dL — ABNORMAL HIGH (ref 70–99)
Glucose-Capillary: 161 mg/dL — ABNORMAL HIGH (ref 70–99)
Glucose-Capillary: 189 mg/dL — ABNORMAL HIGH (ref 70–99)
Glucose-Capillary: 61 mg/dL — ABNORMAL LOW (ref 70–99)
Glucose-Capillary: 78 mg/dL (ref 70–99)
Glucose-Capillary: 83 mg/dL (ref 70–99)

## 2019-07-13 LAB — URINE CULTURE: Culture: NO GROWTH

## 2019-07-13 LAB — POTASSIUM: Potassium: 5.5 mmol/L — ABNORMAL HIGH (ref 3.5–5.1)

## 2019-07-13 MED ORDER — HEPARIN SODIUM (PORCINE) 5000 UNIT/ML IJ SOLN
5000.0000 [IU] | Freq: Three times a day (TID) | INTRAMUSCULAR | Status: DC
  Administered 2019-07-14 (×2): 5000 [IU] via SUBCUTANEOUS
  Filled 2019-07-13 (×4): qty 1

## 2019-07-13 MED ORDER — INSULIN ASPART 100 UNIT/ML ~~LOC~~ SOLN
0.0000 [IU] | Freq: Three times a day (TID) | SUBCUTANEOUS | Status: DC
  Administered 2019-07-13: 3 [IU] via SUBCUTANEOUS
  Administered 2019-07-14: 2 [IU] via SUBCUTANEOUS

## 2019-07-13 MED ORDER — TAMSULOSIN HCL 0.4 MG PO CAPS
0.4000 mg | ORAL_CAPSULE | Freq: Every day | ORAL | Status: DC
  Administered 2019-07-13 – 2019-07-15 (×3): 0.4 mg via ORAL
  Filled 2019-07-13 (×3): qty 1

## 2019-07-13 MED ORDER — OXYCODONE HCL 5 MG PO TABS: 5.0000 mg | ORAL_TABLET | Freq: Four times a day (QID) | ORAL | Status: DC | PRN

## 2019-07-13 MED ORDER — INSULIN ASPART 100 UNIT/ML ~~LOC~~ SOLN: 0.0000 [IU] | Freq: Every day | SUBCUTANEOUS | Status: DC

## 2019-07-13 NOTE — Progress Notes (Signed)
1 Day Post-Op   Subjective/Chief Complaint:  1 - Left Ureteral / Right Renal Stones - s/p bilateral stent placement (6x24 contour) 07/12/19 for 77mm left prox ureteral stone with mild hydro, Rt lower pole non-obstructing stone abtou 70mm on ER CT 06/2019 on eval left flank pain. UA without infectious parameters. NO fevers / tachycardia.   2 - Acute on Chronic Renal Failure - Cr 3.8 with K 5.8 by ER labs, unilateral left  Hydro. He is variably compliant diabetic. Last Cr on record 1.5 2016.    Today "Donald King" is stable. Some low grade fevers overnight. Cr approx stable after stenting.    Objective: Vital signs in last 24 hours: Temp:  [98 F (36.7 C)-99.2 F (37.3 C)] 98 F (36.7 C) (08/14 2209) Pulse Rate:  [64-83] 77 (08/14 2209) Resp:  [14-25] 18 (08/14 2209) BP: (110-172)/(69-97) 126/80 (08/14 2209) SpO2:  [92 %-100 %] 92 % (08/14 2209) Weight:  [69 kg] 69 kg (08/14 0811) Last BM Date: 07/11/19  Intake/Output from previous day: 08/14 0701 - 08/15 0700 In: 2347.8 [P.O.:20; I.V.:1727.8; IV Piggyback:600] Out: 1410 [Urine:1400; Blood:10] Intake/Output this shift: Total I/O In: 142.1 [I.V.:142.1] Out: 1000 [Urine:1000]  Physical Exam  Constitutional: He appears well-developed.  Very pleasant and cooperative in [facility] isolation room.   Head: Normocephalic.  Eyes: Pupils are equal, round, and reactive to light.  Neck: Normal range of motion.  Cardiovascular: Normal rate.  Respiratory: Effort normal.  GI: Soft.  Genitourinary:    Genitourinary Comments: foley in place with non-foul urine . Removed and instructed on imporance of urinal usage for accurate UOP monitoring.  Musculoskeletal: Normal range of motion.  Neurological: He is alert.  Skin: Skin is warm.  Psychiatric: He has a normal mood and affect.   Lab Results:  Recent Labs    07/12/19 0641 07/13/19 0437  WBC 5.3 3.3*  HGB 9.3* 8.1*  HCT 29.7* 24.9*  PLT 211 164   BMET Recent Labs    07/12/19 0641   07/13/19 0014 07/13/19 0437  NA 136  --   --  137  K 5.8*   < > 5.5* 4.8  CL 110  --   --  113*  CO2 17*  --   --  17*  GLUCOSE 227*  --   --  89  BUN 15  --   --  17  CREATININE 3.87*  --   --  3.70*  CALCIUM 9.0  --   --  8.1*   < > = values in this interval not displayed.   PT/INR No results for input(s): LABPROT, INR in the last 72 hours. ABG No results for input(s): PHART, HCO3 in the last 72 hours.  Invalid input(s): PCO2, PO2  Studies/Results: Dg Cystogram  Result Date: 07/12/2019 CLINICAL DATA:  Acute renal failure.  Left ureteral stent placement. EXAM: CYSTOGRAM - 3+ VIEW FLUOROSCOPY TIME:  46 seconds. C-arm fluoroscopic images were obtained intraoperatively and submitted for post operative interpretation. COMPARISON:  CT abdomen pelvis from same day. FINDINGS: Left retrograde pyelogram demonstrates a filling defect in the proximal left ureter near the UPJ corresponding to the calculus seen on CT. Interval placement of a left double-J ureteral stent. Unremarkable right retrograde pyelogram. IMPRESSION: 1. Intraoperative fluoroscopic guidance for left double-J ureteral stent placement. Electronically Signed   By: Titus Dubin M.D.   On: 07/12/2019 19:29   Ct Renal Stone Study  Addendum Date: 07/12/2019   ADDENDUM REPORT: 07/12/2019 09:58 ADDENDUM: Add to IMPRESSION: There is localized  thickening along the posterior aspect of the urinary bladder wall. Question focal cystitis in this area. No well-defined mass by CT evident in this region. This finding potentially may warrant direct visualization of the bladder given the focal nature of the finding. Electronically Signed   By: Lowella Grip III M.D.   On: 07/12/2019 09:58   Result Date: 07/12/2019 CLINICAL DATA:  Left flank region pain EXAM: CT ABDOMEN AND PELVIS WITHOUT CONTRAST TECHNIQUE: Multidetector CT imaging of the abdomen and pelvis was performed following the standard protocol without oral or IV contrast.  COMPARISON:  None. FINDINGS: Lower chest: There is slight bibasilar atelectasis. Lung bases otherwise are clear. Hepatobiliary: There is a tiny calcified granuloma in the right lobe of the liver toward the dome. No other liver lesions are evident on this noncontrast enhanced study. Gallbladder wall is not appreciably thickened. There is no biliary duct dilatation. Pancreas: No pancreatic mass or inflammatory focus. Spleen: No splenic lesions are evident. Adrenals/Urinary Tract: Adrenals bilaterally appear normal. Left kidney is edematous. There is a cyst in the anterior mid left kidney measuring 2.0 x 1.9 cm. There is mild to moderate hydronephrosis on the left. There is no hydronephrosis on the right. There are scattered calculi in the right kidney measuring in size from as small as 1 mm to as large as 4 mm. There is a 3 x 3 mm calculus in the lower pole left kidney. There is a 4 x 1 mm calculus in the mid left kidney with a nearby 2 mm calculus. There is a 1 mm calculus more superiorly on the left. There is a calculus in the proximal left ureter just beyond the ureteropelvic junction measuring 7 x 6 mm. No other ureteral calculi are evident on either side. Urinary bladder is midline with slight wall thickening along the posterior aspect. Remainder of the urinary bladder wall appears unremarkable on this noncontrast enhanced study. Stomach/Bowel: There is no appreciable bowel wall or mesenteric thickening. There is moderate stool in the colon. Terminal ileum appears normal. There is no evident bowel obstruction. No free air or portal venous air. Vascular/Lymphatic: There is no abdominal aortic aneurysm. There are foci of calcification in the distal aorta. No adenopathy is evident in the abdomen or pelvis. Reproductive: Prostate and seminal vesicles are normal in size and contour. There is no evident pelvic mass. Other: Appendix absent. No periappendiceal region inflammatory focus. No abscess or ascites is evident  in the abdomen or pelvis. Musculoskeletal: There is degenerative change in the lumbar spine, most notably at L4-5. There is avascular necrosis in the left femoral head without appreciable flattening. No blastic or lytic bone lesions. No intramuscular or abdominal wall lesions are evident. IMPRESSION: 1. 7 x 6 mm calculus in the proximal left ureter just beyond the left ureteropelvic junction. Mild to moderate hydronephrosis noted on the left. 2. Nonobstructing calculi in each kidney. 3. Mild thickening along the posterior urinary bladder wall which may represent a degree of cystitis. Most of the urinary bladder wall has normal thickness. 4. No bowel obstruction. No abscess in the abdomen pelvis. Appendix absent; no periappendiceal region inflammation. 5. Avascular necrosis in the left femoral head. No appreciable flattening of the left femoral head. Electronically Signed: By: Lowella Grip III M.D. On: 07/12/2019 08:53    Anti-infectives: Anti-infectives (From admission, onward)   Start     Dose/Rate Route Frequency Ordered Stop   07/12/19 0945  cefTRIAXone (ROCEPHIN) 1 g in sodium chloride 0.9 % 100 mL IVPB  1 g 200 mL/hr over 30 Minutes Intravenous  Once 07/12/19 0941 07/12/19 1023      Assessment/Plan:  Now s/p stenting for renal decompression in setting of unilateral obstructing stone. WE will arrange for definitive stone management with bilateral ureterosocpy in elective setting in few weeks. No furhter surgical itervention this admission.   Cautiously optimistic about GFR recovery. He likely has some underlying medical renal disease. Foley remoned and pt instructed on urinal usage for accurate UOP measures.    Low WBC somewhat concerning, agree with pending HIV.   Please call me directly with questions anytime, will follow.   Alexis Frock 07/13/2019

## 2019-07-13 NOTE — Progress Notes (Signed)
   Subjective: Doing well this AM. Pain has resolved. He is wondering when he can go home. He is tolerating PO intake. Discussed that his renal function is still elevated and we would like to see a down trend prior to discharge. Asking about removal of the foley catheter. Discussed coordinating care with Urology. All questions and concerns addressed.   Objective: Vital signs in last 24 hours: Vitals:   07/12/19 1700 07/12/19 1850 07/12/19 2209 07/13/19 0818  BP: 120/82 134/78 126/80 128/86  Pulse:   77   Resp:  18 18 18   Temp: 99 F (37.2 C)  98 F (36.7 C) 98.9 F (37.2 C)  TempSrc: Oral  Oral Oral  SpO2: 97% 98% 92% 100%  Weight:      Height:       General: Well nourished male in no acute distress Pulm: Good air movement with no wheezing or crackles  CV: RRR, no murmurs, no rubs   Assessment/Plan:  Donald King is a 63 y.o male with DM, hypothyroidism, HTN, and HLD who presented to the ED with left flank pain. CT abdomen found nephrolithiasis at the left UPJ resulting in left hydronephrosis. Urology was consulted and he was admitted for further evaluation/management.   Nephrolithiasis with left hydronephrosis  - Symptoms improved significantly  - Multiple stones on CT. Would try to catch stone for analysis  - S/p left ureteral stent placement on 8/14 - Transition from IV dilaudid to PO oxy  - Start Tamsulosin  - Should be okay to remove foley.  - Appreciate Urology recs   AKI on CKD Stage III NAGMA Hyperkalemia  - Unknown baseline. Last known in 2016 was 1.3  - Presented at 3.87, slightly down this AM to 3.70  - Bicarb at 17, related to AKI - Potassium initially elevated at 6.3. Started on [place]. Normalized this AM. Will stop once renal function begins to improve  - Continue NS @125cc .hr  - Recheck BMP this afternoon   COVID-19  - Asymptomatic   HTN  - Continue to hold home lisinopril   Hypothyroidism  - Continue Synthroid 167mcg QD  DM  - CBG at goal  -  Continue SSI   Dispo: Anticipated discharge once renal function starts to improve.   Ina Homes, MD 07/13/2019, 9:02 AM

## 2019-07-14 DIAGNOSIS — E1122 Type 2 diabetes mellitus with diabetic chronic kidney disease: Secondary | ICD-10-CM | POA: Diagnosis not present

## 2019-07-14 DIAGNOSIS — N183 Chronic kidney disease, stage 3 (moderate): Secondary | ICD-10-CM

## 2019-07-14 DIAGNOSIS — I129 Hypertensive chronic kidney disease with stage 1 through stage 4 chronic kidney disease, or unspecified chronic kidney disease: Secondary | ICD-10-CM | POA: Diagnosis not present

## 2019-07-14 DIAGNOSIS — U071 COVID-19: Secondary | ICD-10-CM | POA: Diagnosis not present

## 2019-07-14 DIAGNOSIS — N132 Hydronephrosis with renal and ureteral calculous obstruction: Secondary | ICD-10-CM | POA: Diagnosis not present

## 2019-07-14 DIAGNOSIS — D61818 Other pancytopenia: Secondary | ICD-10-CM

## 2019-07-14 LAB — HEPATITIS B SURFACE ANTIGEN: Hepatitis B Surface Ag: NEGATIVE

## 2019-07-14 LAB — IRON AND TIBC
Iron: 11 ug/dL — ABNORMAL LOW (ref 45–182)
Saturation Ratios: 5 % — ABNORMAL LOW (ref 17.9–39.5)
TIBC: 210 ug/dL — ABNORMAL LOW (ref 250–450)
UIBC: 199 ug/dL

## 2019-07-14 LAB — HEPATIC FUNCTION PANEL
ALT: 13 U/L (ref 0–44)
AST: 18 U/L (ref 15–41)
Albumin: 2.4 g/dL — ABNORMAL LOW (ref 3.5–5.0)
Alkaline Phosphatase: 54 U/L (ref 38–126)
Bilirubin, Direct: <0.1 mg/dL (ref 0.0–0.2)
Total Bilirubin: 0.4 mg/dL (ref 0.3–1.2)
Total Protein: 6 g/dL — ABNORMAL LOW (ref 6.5–8.1)

## 2019-07-14 LAB — CBC WITH DIFFERENTIAL/PLATELET
Abs Immature Granulocytes: 0 10*3/uL (ref 0.00–0.07)
Basophils Absolute: 0 10*3/uL (ref 0.0–0.1)
Basophils Relative: 0 %
Eosinophils Absolute: 0 10*3/uL (ref 0.0–0.5)
Eosinophils Relative: 1 %
HCT: 21.7 % — ABNORMAL LOW (ref 39.0–52.0)
Hemoglobin: 7.2 g/dL — ABNORMAL LOW (ref 13.0–17.0)
Immature Granulocytes: 0 %
Lymphocytes Relative: 20 %
Lymphs Abs: 0.5 10*3/uL — ABNORMAL LOW (ref 0.7–4.0)
MCH: 30 pg (ref 26.0–34.0)
MCHC: 33.2 g/dL (ref 30.0–36.0)
MCV: 90.4 fL (ref 80.0–100.0)
Monocytes Absolute: 0.4 10*3/uL (ref 0.1–1.0)
Monocytes Relative: 14 %
Neutro Abs: 1.6 10*3/uL — ABNORMAL LOW (ref 1.7–7.7)
Neutrophils Relative %: 65 %
Platelets: 144 10*3/uL — ABNORMAL LOW (ref 150–400)
RBC: 2.4 MIL/uL — ABNORMAL LOW (ref 4.22–5.81)
RDW: 12.4 % (ref 11.5–15.5)
WBC: 2.5 10*3/uL — ABNORMAL LOW (ref 4.0–10.5)
nRBC: 0 % (ref 0.0–0.2)

## 2019-07-14 LAB — GLUCOSE, CAPILLARY
Glucose-Capillary: 104 mg/dL — ABNORMAL HIGH (ref 70–99)
Glucose-Capillary: 118 mg/dL — ABNORMAL HIGH (ref 70–99)
Glucose-Capillary: 122 mg/dL — ABNORMAL HIGH (ref 70–99)
Glucose-Capillary: 124 mg/dL — ABNORMAL HIGH (ref 70–99)

## 2019-07-14 LAB — URINALYSIS, ROUTINE W REFLEX MICROSCOPIC
Bilirubin Urine: NEGATIVE
Glucose, UA: NEGATIVE mg/dL
Ketones, ur: NEGATIVE mg/dL
Leukocytes,Ua: NEGATIVE
Nitrite: NEGATIVE
Protein, ur: 30 mg/dL — AB
RBC / HPF: >50 RBC/hpf — ABNORMAL HIGH (ref 0–5)
Specific Gravity, Urine: 1.004 — ABNORMAL LOW (ref 1.005–1.030)
pH: 6 (ref 5.0–8.0)

## 2019-07-14 LAB — CBC
HCT: 21.5 % — ABNORMAL LOW (ref 39.0–52.0)
Hemoglobin: 7 g/dL — ABNORMAL LOW (ref 13.0–17.0)
MCH: 29.5 pg (ref 26.0–34.0)
MCHC: 32.6 g/dL (ref 30.0–36.0)
MCV: 90.7 fL (ref 80.0–100.0)
Platelets: 143 10*3/uL — ABNORMAL LOW (ref 150–400)
RBC: 2.37 MIL/uL — ABNORMAL LOW (ref 4.22–5.81)
RDW: 12.4 % (ref 11.5–15.5)
WBC: 2.4 10*3/uL — ABNORMAL LOW (ref 4.0–10.5)
nRBC: 0 % (ref 0.0–0.2)

## 2019-07-14 LAB — BASIC METABOLIC PANEL
Anion gap: 6 (ref 5–15)
BUN: 18 mg/dL (ref 8–23)
CO2: 19 mmol/L — ABNORMAL LOW (ref 22–32)
Calcium: 7.6 mg/dL — ABNORMAL LOW (ref 8.9–10.3)
Chloride: 110 mmol/L (ref 98–111)
Creatinine, Ser: 3.7 mg/dL — ABNORMAL HIGH (ref 0.61–1.24)
GFR calc Af Amer: 19 mL/min — ABNORMAL LOW (ref >60)
GFR calc non Af Amer: 16 mL/min — ABNORMAL LOW (ref >60)
Glucose, Bld: 115 mg/dL — ABNORMAL HIGH (ref 70–99)
Potassium: 4 mmol/L (ref 3.5–5.1)
Sodium: 135 mmol/L (ref 135–145)

## 2019-07-14 LAB — VITAMIN B12: Vitamin B-12: 141 pg/mL — ABNORMAL LOW (ref 180–914)

## 2019-07-14 LAB — FERRITIN: Ferritin: 43 ng/mL (ref 24–336)

## 2019-07-14 MED ORDER — LACTATED RINGERS IV SOLN
INTRAVENOUS | Status: DC
  Administered 2019-07-14 – 2019-07-15 (×4): via INTRAVENOUS

## 2019-07-14 NOTE — Progress Notes (Signed)
2 Days Post-Op   Subjective/Chief Complaint:   1 - Left Ureteral / Right Renal Stones - s/p bilateral stent placement (6x24 contour) 07/12/19 for 79mm left prox ureteral stone with mild hydro, Rt lower pole non-obstructing stone abtou 51mm on ER CT 06/2019 on eval left flank pain. UA without infectious parameters. NO fevers / tachycardia.   2 - Acute on Chronic Renal Failure - Cr 3.8 with K 5.8 by ER labs, unilateral left  Hydro. He is variably compliant diabetic. Last Cr on record 1.5 2016. Minimal GFR change with stenting.    Today "Strider" is stable. GFR remains unchanged, he has had impressive diuresis.     Objective: Vital signs in last 24 hours: Temp:  [98.2 F (36.8 C)-99.9 F (37.7 C)] 99.7 F (37.6 C) (08/16 0028) Pulse Rate:  [85-92] 92 (08/15 2158) Resp:  [18-20] 18 (08/15 2158) BP: (111-139)/(72-86) 111/72 (08/15 2158) SpO2:  [95 %-100 %] 95 % (08/15 2158) Last BM Date: 07/11/19  Intake/Output from previous day: 08/15 0701 - 08/16 0700 In: 1431.8 [I.V.:1431.8] Out: 4800 [Urine:4800] Intake/Output this shift: No intake/output data recorded.   Physical Exam  Constitutional: He appears well-developed.  Very pleasant and cooperative in [facility] isolation room.   Head: Normocephalic.  Eyes: Pupils are equal, round, and reactive to light.  Neck: Normal range of motion.  Cardiovascular: Normal rate.  Respiratory: Effort normal.  GI: Soft.  Genitourinary:    No CVAT Musculoskeletal: Normal range of motion.  Neurological: He is alert.  Skin: Skin is warm.  Psychiatric: He has a normal mood and affect.    Lab Results:  Recent Labs    07/13/19 0437 07/14/19 0531  WBC 3.3* 2.4*  HGB 8.1* 7.0*  HCT 24.9* 21.5*  PLT 164 143*   BMET Recent Labs    07/13/19 1754 07/14/19 0531  NA 138 135  K 4.6 4.0  CL 109 110  CO2 19* 19*  GLUCOSE 94 115*  BUN 17 18  CREATININE 3.68* 3.70*  CALCIUM 8.1* 7.6*   PT/INR No results for input(s): LABPROT, INR in the last  72 hours. ABG No results for input(s): PHART, HCO3 in the last 72 hours.  Invalid input(s): PCO2, PO2  Studies/Results: Dg Cystogram  Result Date: 07/12/2019 CLINICAL DATA:  Acute renal failure.  Left ureteral stent placement. EXAM: CYSTOGRAM - 3+ VIEW FLUOROSCOPY TIME:  46 seconds. C-arm fluoroscopic images were obtained intraoperatively and submitted for post operative interpretation. COMPARISON:  CT abdomen pelvis from same day. FINDINGS: Left retrograde pyelogram demonstrates a filling defect in the proximal left ureter near the UPJ corresponding to the calculus seen on CT. Interval placement of a left double-J ureteral stent. Unremarkable right retrograde pyelogram. IMPRESSION: 1. Intraoperative fluoroscopic guidance for left double-J ureteral stent placement. Electronically Signed   By: Titus Dubin M.D.   On: 07/12/2019 19:29   Ct Renal Stone Study  Addendum Date: 07/12/2019   ADDENDUM REPORT: 07/12/2019 09:58 ADDENDUM: Add to IMPRESSION: There is localized thickening along the posterior aspect of the urinary bladder wall. Question focal cystitis in this area. No well-defined mass by CT evident in this region. This finding potentially may warrant direct visualization of the bladder given the focal nature of the finding. Electronically Signed   By: Lowella Grip III M.D.   On: 07/12/2019 09:58   Result Date: 07/12/2019 CLINICAL DATA:  Left flank region pain EXAM: CT ABDOMEN AND PELVIS WITHOUT CONTRAST TECHNIQUE: Multidetector CT imaging of the abdomen and pelvis was performed following the standard  protocol without oral or IV contrast. COMPARISON:  None. FINDINGS: Lower chest: There is slight bibasilar atelectasis. Lung bases otherwise are clear. Hepatobiliary: There is a tiny calcified granuloma in the right lobe of the liver toward the dome. No other liver lesions are evident on this noncontrast enhanced study. Gallbladder wall is not appreciably thickened. There is no biliary duct  dilatation. Pancreas: No pancreatic mass or inflammatory focus. Spleen: No splenic lesions are evident. Adrenals/Urinary Tract: Adrenals bilaterally appear normal. Left kidney is edematous. There is a cyst in the anterior mid left kidney measuring 2.0 x 1.9 cm. There is mild to moderate hydronephrosis on the left. There is no hydronephrosis on the right. There are scattered calculi in the right kidney measuring in size from as small as 1 mm to as large as 4 mm. There is a 3 x 3 mm calculus in the lower pole left kidney. There is a 4 x 1 mm calculus in the mid left kidney with a nearby 2 mm calculus. There is a 1 mm calculus more superiorly on the left. There is a calculus in the proximal left ureter just beyond the ureteropelvic junction measuring 7 x 6 mm. No other ureteral calculi are evident on either side. Urinary bladder is midline with slight wall thickening along the posterior aspect. Remainder of the urinary bladder wall appears unremarkable on this noncontrast enhanced study. Stomach/Bowel: There is no appreciable bowel wall or mesenteric thickening. There is moderate stool in the colon. Terminal ileum appears normal. There is no evident bowel obstruction. No free air or portal venous air. Vascular/Lymphatic: There is no abdominal aortic aneurysm. There are foci of calcification in the distal aorta. No adenopathy is evident in the abdomen or pelvis. Reproductive: Prostate and seminal vesicles are normal in size and contour. There is no evident pelvic mass. Other: Appendix absent. No periappendiceal region inflammatory focus. No abscess or ascites is evident in the abdomen or pelvis. Musculoskeletal: There is degenerative change in the lumbar spine, most notably at L4-5. There is avascular necrosis in the left femoral head without appreciable flattening. No blastic or lytic bone lesions. No intramuscular or abdominal wall lesions are evident. IMPRESSION: 1. 7 x 6 mm calculus in the proximal left ureter just  beyond the left ureteropelvic junction. Mild to moderate hydronephrosis noted on the left. 2. Nonobstructing calculi in each kidney. 3. Mild thickening along the posterior urinary bladder wall which may represent a degree of cystitis. Most of the urinary bladder wall has normal thickness. 4. No bowel obstruction. No abscess in the abdomen pelvis. Appendix absent; no periappendiceal region inflammation. 5. Avascular necrosis in the left femoral head. No appreciable flattening of the left femoral head. Electronically Signed: By: Lowella Grip III M.D. On: 07/12/2019 08:53    Anti-infectives: Anti-infectives (From admission, onward)   Start     Dose/Rate Route Frequency Ordered Stop   07/12/19 0945  cefTRIAXone (ROCEPHIN) 1 g in sodium chloride 0.9 % 100 mL IVPB     1 g 200 mL/hr over 30 Minutes Intravenous  Once 07/12/19 0941 07/12/19 1023      Assessment/Plan:  Now s/p stenting for renal decompression in setting of unilateral obstructing stone. WE will arrange for definitive stone management with bilateral ureterosocpy in elective setting in few weeks. No furhter surgical itervention this admission.   Becoming less optimistic about GFR recovery. His baseline medical renal disease appears worse than anticipated.   Agree with anemia / cytopenia workup, again some of this may be driven  by his renal disease.   Please call me directly with questions anytime, will follow.   Alexis Frock 07/14/2019

## 2019-07-14 NOTE — Progress Notes (Signed)
   Subjective: Patient feels great this AM. He is using the bathroom a lot. He was in prison for 4 years and did not have blood work done recently. He was never told he had pancytopenia. He was told he may have kidney disease. Discussed his recent blood work and the need for further investigation. He voices understanding. We will keep in the hospital to watch renal function.   Objective: Vital signs in last 24 hours: Vitals:   07/13/19 1747 07/13/19 2158 07/14/19 0028 07/14/19 0749  BP: 139/79 111/72  (!) 143/74  Pulse:  92    Resp: 18 18  18   Temp: 98.8 F (37.1 C) 99.9 F (37.7 C) 99.7 F (37.6 C) (!) 100.6 F (38.1 C)  TempSrc: Oral Oral Oral Oral  SpO2: 100% 95%  100%  Weight:      Height:       General: Well nourished male in no acute distress Pulm: Good air movement with no wheezing or crackles  CV: RRR, no murmurs, no rubs   Assessment/Plan:  Kohl Lauderman is a 63 y.o male with DM, hypothyroidism, HTN, and HLD who presented to the ED with left flank pain. CT abdomen found nephrolithiasis at the left UPJ resulting in left hydronephrosis. Urology was consulted and he was admitted for further evaluation/management.   Nephrolithiasis with left hydronephrosis  - Symptoms improved significantly  - Multiple stones on CT. Would try to catch stone for analysis  - S/p left ureteral stent placement on 8/14 - Continue Tamsulosin   - Appreciate Urology recs. No further inpatient interventions. Will need outpatient follow-up   AKI on CKD Stage III (Unknown baseline) NAGMA Hyperkalemia  - Creatinine remains elevated at 3.70  - Brisk UOP with ~4.8L over past 24 hours  - Post obstructive diuresis, increase IVF to 200cc/hr  - Bicarb at 19, related to AKI - Discontinue Lokelma   Pancytopenia  - With IVF he is pancytopenic  - Iron studies consistent with iron deficiency anemia - HIV negative   - Checking additional labs including B12, folate, hepatitis B/C, AFB, Quant gold, and  blood smear  - Will need close outpatient follow-up. He will [facility] with [facility] in the [facility]  COVID-19  - Asymptomatic   HTN  - Continue to hold home lisinopril   Hypothyroidism  - Continue Synthroid 1105mcg QD  DM  - CBG at goal  - Continue SSI   Dispo: Anticipated discharge once renal function starts to improve.   Ina Homes, MD 07/14/2019, 10:49 AM

## 2019-07-15 DIAGNOSIS — N179 Acute kidney failure, unspecified: Secondary | ICD-10-CM

## 2019-07-15 DIAGNOSIS — E872 Acidosis: Secondary | ICD-10-CM

## 2019-07-15 DIAGNOSIS — E538 Deficiency of other specified B group vitamins: Secondary | ICD-10-CM

## 2019-07-15 DIAGNOSIS — Z7984 Long term (current) use of oral hypoglycemic drugs: Secondary | ICD-10-CM

## 2019-07-15 DIAGNOSIS — D61818 Other pancytopenia: Secondary | ICD-10-CM

## 2019-07-15 DIAGNOSIS — U071 COVID-19: Secondary | ICD-10-CM

## 2019-07-15 DIAGNOSIS — E039 Hypothyroidism, unspecified: Secondary | ICD-10-CM

## 2019-07-15 DIAGNOSIS — Z960 Presence of urogenital implants: Secondary | ICD-10-CM

## 2019-07-15 DIAGNOSIS — I129 Hypertensive chronic kidney disease with stage 1 through stage 4 chronic kidney disease, or unspecified chronic kidney disease: Secondary | ICD-10-CM | POA: Diagnosis not present

## 2019-07-15 DIAGNOSIS — E875 Hyperkalemia: Secondary | ICD-10-CM

## 2019-07-15 DIAGNOSIS — N184 Chronic kidney disease, stage 4 (severe): Secondary | ICD-10-CM

## 2019-07-15 DIAGNOSIS — E1122 Type 2 diabetes mellitus with diabetic chronic kidney disease: Secondary | ICD-10-CM | POA: Diagnosis not present

## 2019-07-15 DIAGNOSIS — E785 Hyperlipidemia, unspecified: Secondary | ICD-10-CM

## 2019-07-15 DIAGNOSIS — N132 Hydronephrosis with renal and ureteral calculous obstruction: Secondary | ICD-10-CM | POA: Diagnosis not present

## 2019-07-15 DIAGNOSIS — Z79890 Hormone replacement therapy (postmenopausal): Secondary | ICD-10-CM

## 2019-07-15 DIAGNOSIS — Z79899 Other long term (current) drug therapy: Secondary | ICD-10-CM

## 2019-07-15 LAB — BASIC METABOLIC PANEL
Anion gap: 7 (ref 5–15)
BUN: 18 mg/dL (ref 8–23)
CO2: 20 mmol/L — ABNORMAL LOW (ref 22–32)
Calcium: 7.9 mg/dL — ABNORMAL LOW (ref 8.9–10.3)
Chloride: 110 mmol/L (ref 98–111)
Creatinine, Ser: 3.57 mg/dL — ABNORMAL HIGH (ref 0.61–1.24)
GFR calc Af Amer: 20 mL/min — ABNORMAL LOW (ref >60)
GFR calc non Af Amer: 17 mL/min — ABNORMAL LOW (ref >60)
Glucose, Bld: 133 mg/dL — ABNORMAL HIGH (ref 70–99)
Potassium: 4 mmol/L (ref 3.5–5.1)
Sodium: 137 mmol/L (ref 135–145)

## 2019-07-15 LAB — CBC
HCT: 22.6 % — ABNORMAL LOW (ref 39.0–52.0)
Hemoglobin: 7.5 g/dL — ABNORMAL LOW (ref 13.0–17.0)
MCH: 30 pg (ref 26.0–34.0)
MCHC: 33.2 g/dL (ref 30.0–36.0)
MCV: 90.4 fL (ref 80.0–100.0)
Platelets: 134 10*3/uL — ABNORMAL LOW (ref 150–400)
RBC: 2.5 MIL/uL — ABNORMAL LOW (ref 4.22–5.81)
RDW: 12.2 % (ref 11.5–15.5)
WBC: 2.2 10*3/uL — ABNORMAL LOW (ref 4.0–10.5)
nRBC: 0 % (ref 0.0–0.2)

## 2019-07-15 LAB — FOLATE RBC
Folate, Hemolysate: 277 ng/mL
Folate, RBC: 1345 ng/mL (ref >498)
Hematocrit: 20.6 % — ABNORMAL LOW (ref 37.5–51.0)

## 2019-07-15 LAB — GLUCOSE, CAPILLARY: Glucose-Capillary: 179 mg/dL — ABNORMAL HIGH (ref 70–99)

## 2019-07-15 MED ORDER — FERROUS SULFATE 325 (65 FE) MG PO TABS: 325.0000 mg | ORAL_TABLET | Freq: Every day | ORAL | 0 refills | Status: DC

## 2019-07-15 MED ORDER — VITAMIN B-12 1000 MCG PO TABS: 1000.0000 ug | ORAL_TABLET | Freq: Every day | ORAL | 0 refills | Status: DC

## 2019-07-15 MED ORDER — TAMSULOSIN HCL 0.4 MG PO CAPS: 0.4000 mg | ORAL_CAPSULE | Freq: Every day | ORAL | 0 refills | Status: DC

## 2019-07-15 MED ORDER — OXYCODONE HCL 5 MG PO TABS: 5.0000 mg | ORAL_TABLET | Freq: Four times a day (QID) | ORAL | 0 refills | Status: AC | PRN

## 2019-07-15 MED ORDER — CYANOCOBALAMIN 1000 MCG/ML IJ SOLN
1000.0000 ug | Freq: Once | INTRAMUSCULAR | Status: AC
  Administered 2019-07-15: 1000 ug via INTRAMUSCULAR
  Filled 2019-07-15: qty 1

## 2019-07-15 NOTE — Op Note (Signed)
NAMETERRICK, DANKERS MEDICAL RECORD [id number] ACCOUNT [id number] DATE OF BIRTH:Nov 12, 1956 FACILITY: [facility] LOCATION: [facility] PHYSICIAN:Marjo Grosvenor, MD  OPERATIVE REPORT  DATE OF PROCEDURE:  07/12/2019  PREOPERATIVE DIAGNOSES:  Left ureteral stone, bilateral renal stones, acute renal failure.  POSTOPERATIVE DIAGNOSES:   Left ureteral stone, bilateral renal stones, acute renal failure.  PROCEDURE: 1.  Cystoscopy, bilateral retrograde pyelograms, interpretation. 2.  Insertion of bilateral ureteral stents 6 x 24 Contour, no tether. 3.  Left diagnostic ureteroscopy.  ESTIMATED BLOOD LOSS:  Nil.  MEDICATIONS:  None.  SPECIMENS:  None.  FINDINGS: 1.  Left proximal ureteral stone with mild hydronephrosis. 2.  Unremarkable right retrograde pyelogram. 3.  Successful placement of bilateral ureteral stents, proximal end in the renal pelvis, distal end in the urinary bladder. 4.  Small left distal intramural ureteral flap necessitating diagnostic ureteroscopy to navigate above this.  DRAINS:  A 16-French Foley catheter to straight drain.  INDICATIONS:  The patient is a 63 year old man with history of likely some mild chronic renal insufficiency.  He was found on workup of left flank pain and acute renal failure to have a left proximal ureteral stone, mild hydronephrosis, bilateral renal  stones, and incidentally asymptomatic COVID positive.  Options discussed for management with recommended path of urgent renal decompression today, followed by definitive stone management in the elective setting, and he wished to proceed with stenting for  renal decompression.  Informed consent was obtained and placed in the medical record.  PROCEDURE IN DETAIL:  The patient being identified and procedure being bilateral ureteral stent placement confirmed.  Procedure time-out was performed.  IV antibiotics administered.  General anesthesia was introduced with COVID precautions.  All minimal  staff in the  operative suite.  Next, cystourethroscopy was performed using a 21-French rigid cystoscope with offset lens.  Inspection of anterior and posterior urethra were unremarkable.  Inspection of the bladder revealed no diverticula, calcifications,  or papillary lesions.  The left ureteral orifice was cannulated with 6-French renal catheter.  There was a very right angulation to the left ureteral orifice.  Left retrograde pyelogram was obtained.  Left retrograde pyelogram demonstrates a single left ureter, a single-system left kidney.  There was a filling defect in the proximal ureter consistent with known stone.  An attempt was made to pass a sensor wire to the level of the stone.  However, this  curled in the intramural ureter.  An angle-tipped ZIPwire was similarly used, but also curled in the distal ureter.  This was concerning for possible development of a distal flap.  As such, semi-rigid ureteroscopy was performed in the distal left  intramural ureter using a semirigid ureteroscope, and indeed a small internal flap was noted.  Under vision, the angle-tip Glidewire was advanced into the true lumen of the ureter at the level of the renal pelvis over which a new 6 x 24 Contour-type  stent was placed using cystoscopic and fluoroscopic guidance.  Good proximal and distal plane were noted.  Similarly, right retrograde pyelogram was obtained.  Right retrograde pyelogram demonstrated a single right ureter, single-system left upper and right kidney.  No filling defects or narrowing noted.  A 0.038 ZIPwire was advanced to the level of the upper pole of which a new 6 x 24 Contour Polaris-type  stent was placed using cystoscopic and fluoroscopic guidance.  Good proximal and distal plane were noted.  Efflux of urine was seen around into just one of the stents.  Given the patient's acute renal failure, it was felt  that there was need for accurate  uterine output monitoring.  As such, a new Foley catheter was placed free  to straight drain, 10 mL sterile water in the balloon.  The procedure was terminated.  The patient tolerated the procedure well.  No immediate perioperative complications.  The  patient to remain in the operative suite for recovery given COVID positivity.  LN/NUANCE  D:07/12/2019 T:07/12/2019 JOB:[id number]

## 2019-07-15 NOTE — Plan of Care (Signed)

## 2019-07-15 NOTE — Progress Notes (Signed)
   Subjective: Anxious to go home today. Discussed the result of his lab work and the importance of follow-up. He will follow-up with the [facility] downstairs. He will call urology to schedule a follow-up appointment. Discussed isolation since he is COVID positive. All questions and concerns addressed.   Objective: Vital signs in last 24 hours: Vitals:   07/14/19 1643 07/14/19 2223 07/15/19 0336 07/15/19 0800  BP: 139/77 131/64 126/70 139/76  Pulse:  76 80 79  Resp:  17 16 16   Temp:  99.9 F (37.7 C) 99.7 F (37.6 C) (!) 100.5 F (38.1 C)  TempSrc:  Oral Oral Oral  SpO2:  96% 98% 100%  Weight:      Height:       General: Well nourished male in no acute distress Pulm: Good air movement with no wheezing or crackles  CV: RRR, no murmurs, no rubs   Assessment/Plan:  Donald King is a 63 y.o male with DM, hypothyroidism, HTN, and HLD who presented to the ED with left flank pain. CT abdomen found nephrolithiasis at the left UPJ resulting in left hydronephrosis. Urology was consulted and he was admitted for further evaluation/management.   Nephrolithiasis with left hydronephrosis  - Symptoms improved significantly  - Multiple stones on CT. Would try to catch stone for analysis  - S/p left ureteral stent placement on 8/14 - Continue Tamsulosin   - Appreciate Urology recs. No further inpatient interventions. Will need outpatient follow-up   AKI on CKD Stage III (Unknown baseline) NAGMA Hyperkalemia  - Creatinine remains elevated at 3.5  - Brisk UOP with ~2.4L over past 24 hours  - Will need close outpatient follow-up   Pancytopenia  - With IVF he is pancytopenic  - Iron studies consistent with iron deficiency anemia - HIV, hepatitis B negative   - Checking additional labs including folate, hepatitis C, AFB, Quant gold, and blood smear  - Will need close outpatient follow-up. He will follow with me in the [facility]  COVID-19  - Asymptomatic   HTN  - Continue to hold home lisinopril  -  Hold on discharge given low GFR  Hypothyroidism  - Continue Synthroid 172mcg QD  DM  - CBG at goal  - Continue SSI   Dispo: Anticipated discharge today.   Ina Homes, MD 07/15/2019, 9:06 AM

## 2019-07-15 NOTE — Discharge Summary (Signed)
Name: Donald King MRN: [id number] DOB: 1956/07/12 63 y.o. PCP: Patient, No Pcp Per  Date of Admission: 07/12/2019  6:30 AM Date of Discharge: 07/15/2019 Attending Physician: Lucious Groves, DO  Discharge Diagnosis: 1. Nephrolithiasis with left hydronephrosis 2. CKD Stage IV  3. Pancytopenia  4. COVID 5. DM  6. HTN  Discharge Medications: Allergies as of 07/15/2019   No Known Allergies     Medication List    STOP taking these medications   lisinopril 10 MG tablet Commonly known as: ZESTRIL   metFORMIN 500 MG tablet Commonly known as: Glucophage     TAKE these medications   ferrous sulfate 325 (65 FE) MG tablet Take 1 tablet (325 mg total) by mouth daily.   levothyroxine 112 MCG tablet Commonly known as: SYNTHROID TAKE ONE TABLET BY MOUTH ONCE DAILY   oxyCODONE 5 MG immediate release tablet Commonly known as: Oxy IR/ROXICODONE Take 1 tablet (5 mg total) by mouth every 6 (six) hours as needed for up to 5 days for moderate pain.   tamsulosin 0.4 MG Caps capsule Commonly known as: FLOMAX Take 1 capsule (0.4 mg total) by mouth daily.   vitamin B-12 1000 MCG tablet Commonly known as: CYANOCOBALAMIN Take 1 tablet (1,000 mcg total) by mouth daily.     Disposition and follow-up:   [name] was discharged from [facility] in Stable condition.  At the hospital follow up visit please address:  1.  Nephrolithiasis. Ensure he has called to schedule a follow-up with urology. CKD. Repeat a BMP, UA, and protein to creatinine ratio. Pancytopenia. Follow-up on the labs listed below and recheck a CBC. DM. Check an A1c.   2.  Labs / imaging needed at time of follow-up: CBC, BMP, UA, Protein to creatinine ratio, A1c  3.  Pending labs/ test needing follow-up: Folate, hepatitis C, AFB, Quant gold, and blood smear.   Follow-up Appointments: Follow-up Information    [facility] Follow up.   Why: They will call you with an  appointment.  Contact information: [address] [place] ON:[id number]       Alexis Frock, MD. Call.   Specialty: Urology Contact information: [place] [phone]         Hospital Course by problem list:  1. Nephrolithiasis with left hydronephrosis. Donald King is a 63 y.o male with DM, hypothyroidism, HTN, and HLD who presented to the ED with left flank pain. CT abdomen illustrated nephrolithiasis at the left UPJ resulting in left hydronephrosis. Urology was consulted and he subsequently when for bilateral ureteral stent placement on 8/14. In addition, he was started on Tamsulosin 0.4 mg QD. His pain significantly improved and urology recommended outpatient follow-up for elective bilateral ureteroscopy. He was given the contact information for [facility] and will follow-up with them in 1-2 weeks.   2. CKD Stage IV. Patient was recently released from prison. His baseline renal function was found to be 3.8 on admission. Initially there was hope that with relief of the obstruction his renal function would improve. However, with relief of the obstruction and aggressive IVF his renal function remained elevated at 3.7. UA was obtained but unreliable due to the nephrolithiasis. His home lisinopril was discontinued and he will follow-up with the [facility].  3. Pancytopenia. With aggressive IVF the patient was found to be pancytopenic. Investigation was initiated. He tested negative for HIV and hepatitis B. His vitamin B12 level was found to  be low and he was started on replacement therapy. His iron studies were also consistent with iron deficiency anemia and he was started on iron supplementation. Pending labs including folate, hepatitis C, AFB, Quant gold, and blood smear. These will be followed up on as an outpatient.   4. COVID. Patient tested positive for COVID-19. He is asymptomatic. Discussed isolation upon discharge.   5. DM.  Patient was on Metformin on admission. Given his renal function this was discontinued. He remained euglycemic with minimal insulin coverage. He will follow-up as an outpatient.   6. HTN. Patient's lisinopril was discontinued due to his low GFR. He will need close outpatient follow-up.   Discharge Vitals:   BP 139/76 (BP Location: Left Arm)   Pulse 79   Temp (!) 100.5 F (38.1 C) (Oral)   Resp 16   Ht 5\' 6"  (1.676 m)   Wt 69 kg   SpO2 100%   BMI 24.55 kg/m   Pertinent Labs, Studies, and Procedures:   CT Renal Stone Study  1. 7 x 6 mm calculus in the proximal left ureter just beyond the left ureteropelvic junction. Mild to moderate hydronephrosis noted on the left. 2. Nonobstructing calculi in each kidney. 3. Mild thickening along the posterior urinary bladder wall which may represent a degree of cystitis. Most of the urinary bladder wall has normal thickness. 4. No bowel obstruction. No abscess in the abdomen pelvis. Appendix absent; no periappendiceal region inflammation. 5. Avascular necrosis in the left femoral head. No appreciable flattening of the left femoral head.  Discharge Instructions: Discharge Instructions    Call MD for:  persistant nausea and vomiting   Complete by: As directed    Call MD for:  severe uncontrolled pain   Complete by: As directed    Call MD for:  temperature >100.4   Complete by: As directed    Diet - low sodium heart healthy   Complete by: As directed    Discharge instructions   Complete by: As directed    Thank you for allowing [place] to provide your care. The internal medicine clinic will call you to arrange a follow-up appointment. You will need to call the urology office to schedule a follow-up appointment. We are starting some new medications and it is important that you take these as prescribed.   You also tested positive for COVID. It is important that you isolate from other for atleast 14 day .   Increase activity slowly   Complete by:  As directed     Signed: Ina Homes, MD 07/15/2019, 9:10 AM

## 2019-07-16 LAB — HCV COMMENT:

## 2019-07-16 LAB — HEPATITIS C ANTIBODY (REFLEX): HCV Ab: 0.1 s/co ratio (ref 0.0–0.9)

## 2019-07-16 LAB — PATHOLOGIST SMEAR REVIEW

## 2019-07-16 LAB — ACID FAST SMEAR (AFB, MYCOBACTERIA): Acid Fast Smear: NEGATIVE

## 2019-07-17 ENCOUNTER — Other Ambulatory Visit: Payer: Self-pay | Admitting: Urology

## 2019-07-17 LAB — QUANTIFERON-TB GOLD PLUS: QuantiFERON-TB Gold Plus: NEGATIVE

## 2019-07-17 LAB — QUANTIFERON-TB GOLD PLUS (RQFGPL)
QuantiFERON Mitogen Value: 0.97 IU/mL
QuantiFERON Nil Value: 0.02 IU/mL
QuantiFERON TB1 Ag Value: 0.02 IU/mL
QuantiFERON TB2 Ag Value: 0.02 IU/mL

## 2019-07-18 ENCOUNTER — Encounter: Payer: Self-pay | Admitting: Internal Medicine

## 2019-08-01 ENCOUNTER — Encounter: Payer: Self-pay | Admitting: Internal Medicine

## 2019-08-02 NOTE — Patient Instructions (Addendum)
DUE TO COVID-19 ONLY ONE VISITOR IS ALLOWED TO COME WITH YOU AND STAY IN THE WAITING ROOM ONLY DURING PRE OP AND PROCEDURE DAY OF SURGERY.  THE 1 VISITOR MAY VISIT WITH YOU AFTER SURGERY IN YOUR PRIVATE ROOM DURING VISITING HOURS ONLY!   YOU NEED TO HAVE A COVID 19 TEST ON_9/8______ @__2 :35_____, THIS TEST MUST BE DONE BEFORE SURGERY, COME  [address], [place] , [address].  ([place])  ONCE YOUR COVID TEST IS COMPLETED, PLEASE BEGIN THE QUARANTINE INSTRUCTIONS AS OUTLINED IN YOUR HANDOUT.                [name]   Your procedure is scheduled on: Friday 08/09/19   Report to [facility] Main  Entrance   Report to admitting at   12:45 AM     Call this number if you have problems the morning of surgery [phone]    Remember: Do not eat food or drink liquids :After Midnight . BRUSH YOUR TEETH MORNING OF SURGERY AND RINSE YOUR MOUTH OUT, NO CHEWING GUM CANDY OR MINTS.     Take these medicines the morning of surgery with A SIP OF WATER:  Levothyrovine  DO NOT TAKE ANY DIABETIC MEDICATIONS DAY OF YOUR SURGERY     How to Manage Your Diabetes Before and After Surgery  Why is it important to control my blood sugar before and after surgery? . Improving blood sugar levels before and after surgery helps healing and can limit problems. . A way of improving blood sugar control is eating a healthy diet by: o  Eating less sugar and carbohydrates o  Increasing activity/exercise o  Talking with your doctor about reaching your blood sugar goals . High blood sugars (greater than 180 mg/dL) can raise your risk of infections and slow your recovery, so you will need to focus on controlling your diabetes during the weeks before surgery. . Make sure that the doctor who takes care of your diabetes knows about your planned surgery including the date and location.  How do I manage my blood sugar before surgery? . Check your blood sugar at least 4 times a day,  starting 2 days before surgery, to make sure that the level is not too high or low. o Check your blood sugar the morning of your surgery when you wake up and every 2 hours until you get to the Short Stay unit. . If your blood sugar is less than 70 mg/dL, you will need to treat for low blood sugar: o Do not take insulin. o Treat a low blood sugar (less than 70 mg/dL) with  cup of clear juice (cranberry or apple), 4 glucose tablets, OR glucose gel. o Recheck blood sugar in 15 minutes after treatment (to make sure it is greater than 70 mg/dL). If your blood sugar is not greater than 70 mg/dL on recheck, call [phone] for further instructions. . Report your blood sugar to the short stay nurse when you get to Short Stay.  . If you are admitted to the hospital after surgery: o Your blood sugar will be checked by the staff and you will probably be given insulin after surgery (instead of oral diabetes medicines) to make sure you have good blood sugar levels. o The goal for blood sugar control after surgery is 80-180 mg/dL.   WHAT DO I DO ABOUT MY DIABETES MEDICATION?  Marland Kitchen Do not take oral diabetes medicines (pills) the morning of surgery.  .       .   .  You may not have any metal on your body including hair pins and              piercings  Do not wear jewelry, make-up, lotions, powders or perfumes, deodorant             Do not wear nail polish.  Do not shave  48 hours prior to surgery.              Men may shave face and neck.   Do not bring valuables to the hospital. [place].  Contacts, dentures or bridgework may not be worn into surgery.  Leave suitcase in the car. After surgery it may be brought to your room.     Patients discharged the day of surgery will not be allowed to drive home.  IF YOU ARE HAVING SURGERY AND GOING HOME THE SAME DAY, YOU MUST HAVE AN ADULT TO DRIVE YOU HOME AND BE WITH YOU FOR 24 HOURS.  YOU  MAY GO HOME BY TAXI OR UBER OR ORTHERWISE, BUT AN ADULT MUST ACCOMPANY YOU HOME AND STAY WITH YOU FOR 24 HOURS.  Name and phone number of your driver:  Special Instructions: N/A              Please read over the following fact sheets you were given: _____________________________________________________________________             [facility] - Preparing for Surgery  Before surgery, you can play an important role.   Because skin is not sterile, your skin needs to be as free of germs as possible.   You can reduce the number of germs on your skin by washing with CHG (chlorahexidine gluconate) soap before surgery.   CHG is an antiseptic cleaner which kills germs and bonds with the skin to continue killing germs even after washing. Please DO NOT use if you have an allergy to CHG or antibacterial soaps .  If your skin becomes reddened/irritated stop using the CHG and inform your nurse when you arrive at Short Stay. .  You may shave your face/neck.  Please follow these instructions carefully:  1.  Shower with CHG Soap the night before surgery and the  morning of Surgery.  2.  If you choose to wash your hair, wash your hair first as usual with your  normal  shampoo.  3.  After you shampoo, rinse your hair and body thoroughly to remove the  shampoo.                                        4.  Use CHG as you would any other liquid soap.  You can apply chg directly  to the skin and wash                       Gently with a scrungie or clean washcloth.  5.  Apply the CHG Soap to your body ONLY FROM THE NECK DOWN.   Do not use on face/ open                           Wound or open sores. Avoid contact with eyes, ears mouth and genitals (private parts).  Wash face,  Genitals (private parts) with your normal soap.             6.  Wash thoroughly, paying special attention to the area where your surgery  will be performed.  7.  Thoroughly rinse your body with warm water from the neck  down.  8.  DO NOT shower/wash with your normal soap after using and rinsing off  the CHG Soap.             9.  Pat yourself dry with a clean towel.            10.  Wear clean pajamas.            11.  Place clean sheets on your bed the night of your first shower and do not  sleep with pets.  Day of Surgery : Do not apply any lotions/deodorants the morning of surgery.  Please wear clean clothes to the hospital/surgery center.   FAILURE TO FOLLOW THESE INSTRUCTIONS MAY RESULT IN THE CANCELLATION OF YOUR SURGERY PATIENT SIGNATURE_________________________________  NURSE SIGNATURE__________________________________  ________________________________________________________________________

## 2019-08-06 ENCOUNTER — Other Ambulatory Visit: Payer: Self-pay

## 2019-08-06 ENCOUNTER — Encounter (HOSPITAL_COMMUNITY): Payer: Self-pay

## 2019-08-06 ENCOUNTER — Encounter (HOSPITAL_COMMUNITY)
Admission: RE | Admit: 2019-08-06 | Discharge: 2019-08-06 | Disposition: A | Discharge status: Home / Self Care | Payer: Self-pay | Source: Ambulatory Visit | Attending: Urology | Admitting: Urology

## 2019-08-06 ENCOUNTER — Inpatient Hospital Stay (HOSPITAL_COMMUNITY)
Admission: RE | Admit: 2019-08-06 | Discharge: 2019-08-06 | Disposition: A | Discharge status: Home / Self Care | Payer: Self-pay | Source: Ambulatory Visit

## 2019-08-06 DIAGNOSIS — Z01812 Encounter for preprocedural laboratory examination: Secondary | ICD-10-CM | POA: Insufficient documentation

## 2019-08-06 HISTORY — DX: Essential (primary) hypertension: I10

## 2019-08-06 LAB — CBC
HCT: 26 % — ABNORMAL LOW (ref 39.0–52.0)
Hemoglobin: 8.1 g/dL — ABNORMAL LOW (ref 13.0–17.0)
MCH: 28.9 pg (ref 26.0–34.0)
MCHC: 31.2 g/dL (ref 30.0–36.0)
MCV: 92.9 fL (ref 80.0–100.0)
Platelets: 290 10*3/uL (ref 150–400)
RBC: 2.8 MIL/uL — ABNORMAL LOW (ref 4.22–5.81)
RDW: 15.1 % (ref 11.5–15.5)
WBC: 5.9 10*3/uL (ref 4.0–10.5)
nRBC: 0 % (ref 0.0–0.2)

## 2019-08-06 LAB — BASIC METABOLIC PANEL
Anion gap: 10 (ref 5–15)
BUN: 14 mg/dL (ref 8–23)
CO2: 17 mmol/L — ABNORMAL LOW (ref 22–32)
Calcium: 8.8 mg/dL — ABNORMAL LOW (ref 8.9–10.3)
Chloride: 115 mmol/L — ABNORMAL HIGH (ref 98–111)
Creatinine, Ser: 2.94 mg/dL — ABNORMAL HIGH (ref 0.61–1.24)
GFR calc Af Amer: 25 mL/min — ABNORMAL LOW (ref >60)
GFR calc non Af Amer: 22 mL/min — ABNORMAL LOW (ref >60)
Glucose, Bld: 110 mg/dL — ABNORMAL HIGH (ref 70–99)
Potassium: 5.5 mmol/L — ABNORMAL HIGH (ref 3.5–5.1)
Sodium: 142 mmol/L (ref 135–145)

## 2019-08-06 LAB — GLUCOSE, CAPILLARY: Glucose-Capillary: 107 mg/dL — ABNORMAL HIGH (ref 70–99)

## 2019-08-06 NOTE — Progress Notes (Signed)
Per Dr. Tresa Moore due to patients last positive COVID test being over 21 days the patient does not need to be re-swabbed for COVID

## 2019-08-06 NOTE — Progress Notes (Signed)
PCP - none Cardiologist - none  Chest x-ray - no EKG -07/16/19  Stress Test - no ECHO - no Cardiac Cath - no  Sleep Study - no CPAP -   Fasting Blood Sugar - 80s-100 Checks Blood Sugar _2 times a week____ times a day  Blood Thinner Instructions:NA Aspirin Instructions: Last Dose:  Anesthesia review:   Patient denies shortness of breath, fever, cough and chest pain at PAT appointment yes  Patient verbalized understanding of instructions that were given to them at the PAT appointment. Patient was also instructed that they will need to review over the PAT instructions again at home before surgery. Yes Pt is looking for a PCP. He was released from prison in July or Aug. Then tested + for covid. It has been 22 day since + test.  His BP was  Borderline high and he has not taken his lisinopril in several months. PAT RN suggested he monitor his BP at home and get a PCP asap.

## 2019-08-07 LAB — HEMOGLOBIN A1C
Hgb A1c MFr Bld: 8 % — ABNORMAL HIGH (ref 4.8–5.6)
Mean Plasma Glucose: 183 mg/dL

## 2019-08-09 ENCOUNTER — Ambulatory Visit (HOSPITAL_COMMUNITY): Payer: Self-pay | Admitting: Physician Assistant

## 2019-08-09 ENCOUNTER — Encounter (HOSPITAL_COMMUNITY)
Admission: RE | Disposition: A | Discharge status: Home / Self Care | Payer: Self-pay | Source: Other Acute Inpatient Hospital | Attending: Urology

## 2019-08-09 ENCOUNTER — Other Ambulatory Visit: Payer: Self-pay

## 2019-08-09 ENCOUNTER — Ambulatory Visit (HOSPITAL_COMMUNITY)
Admission: RE | Admit: 2019-08-09 | Discharge: 2019-08-09 | Disposition: A | Discharge status: Home / Self Care | Payer: Self-pay | Source: Other Acute Inpatient Hospital | Attending: Urology | Admitting: Urology

## 2019-08-09 ENCOUNTER — Encounter (HOSPITAL_COMMUNITY): Payer: Self-pay

## 2019-08-09 ENCOUNTER — Ambulatory Visit (HOSPITAL_COMMUNITY): Payer: Self-pay

## 2019-08-09 DIAGNOSIS — Z8619 Personal history of other infectious and parasitic diseases: Secondary | ICD-10-CM | POA: Insufficient documentation

## 2019-08-09 DIAGNOSIS — M179 Osteoarthritis of knee, unspecified: Secondary | ICD-10-CM | POA: Insufficient documentation

## 2019-08-09 DIAGNOSIS — M1990 Unspecified osteoarthritis, unspecified site: Secondary | ICD-10-CM | POA: Insufficient documentation

## 2019-08-09 DIAGNOSIS — E1122 Type 2 diabetes mellitus with diabetic chronic kidney disease: Secondary | ICD-10-CM | POA: Insufficient documentation

## 2019-08-09 DIAGNOSIS — Z7984 Long term (current) use of oral hypoglycemic drugs: Secondary | ICD-10-CM | POA: Insufficient documentation

## 2019-08-09 DIAGNOSIS — N201 Calculus of ureter: Secondary | ICD-10-CM | POA: Insufficient documentation

## 2019-08-09 DIAGNOSIS — Z79890 Hormone replacement therapy (postmenopausal): Secondary | ICD-10-CM | POA: Insufficient documentation

## 2019-08-09 DIAGNOSIS — I129 Hypertensive chronic kidney disease with stage 1 through stage 4 chronic kidney disease, or unspecified chronic kidney disease: Secondary | ICD-10-CM | POA: Insufficient documentation

## 2019-08-09 DIAGNOSIS — N189 Chronic kidney disease, unspecified: Secondary | ICD-10-CM | POA: Insufficient documentation

## 2019-08-09 DIAGNOSIS — E039 Hypothyroidism, unspecified: Secondary | ICD-10-CM | POA: Insufficient documentation

## 2019-08-09 DIAGNOSIS — N179 Acute kidney failure, unspecified: Secondary | ICD-10-CM | POA: Insufficient documentation

## 2019-08-09 HISTORY — PX: CYSTOSCOPY WITH RETROGRADE PYELOGRAM, URETEROSCOPY AND STENT PLACEMENT: SHX5789

## 2019-08-09 HISTORY — PX: HOLMIUM LASER APPLICATION: SHX5852

## 2019-08-09 LAB — BASIC METABOLIC PANEL
Anion gap: 3 — ABNORMAL LOW (ref 5–15)
BUN: 11 mg/dL (ref 8–23)
CO2: 20 mmol/L — ABNORMAL LOW (ref 22–32)
Calcium: 8.5 mg/dL — ABNORMAL LOW (ref 8.9–10.3)
Chloride: 117 mmol/L — ABNORMAL HIGH (ref 98–111)
Creatinine, Ser: 2.68 mg/dL — ABNORMAL HIGH (ref 0.61–1.24)
GFR calc Af Amer: 28 mL/min — ABNORMAL LOW (ref >60)
GFR calc non Af Amer: 24 mL/min — ABNORMAL LOW (ref >60)
Glucose, Bld: 99 mg/dL (ref 70–99)
Potassium: 4.4 mmol/L (ref 3.5–5.1)
Sodium: 140 mmol/L (ref 135–145)

## 2019-08-09 LAB — GLUCOSE, CAPILLARY
Glucose-Capillary: 97 mg/dL (ref 70–99)
Glucose-Capillary: 86 mg/dL (ref 70–99)

## 2019-08-09 SURGERY — CYSTOURETEROSCOPY, WITH RETROGRADE PYELOGRAM AND STENT INSERTION
Anesthesia: General | Laterality: Bilateral

## 2019-08-09 MED ORDER — ROCURONIUM BROMIDE 10 MG/ML (PF) SYRINGE
PREFILLED_SYRINGE | INTRAVENOUS | Status: AC
  Filled 2019-08-09: qty 10

## 2019-08-09 MED ORDER — IOHEXOL 300 MG/ML  SOLN
INTRAMUSCULAR | Status: DC | PRN
  Administered 2019-08-09: 16:00:00 20 mL

## 2019-08-09 MED ORDER — FENTANYL CITRATE (PF) 250 MCG/5ML IJ SOLN
INTRAMUSCULAR | Status: DC | PRN
  Administered 2019-08-09: 100 ug via INTRAVENOUS

## 2019-08-09 MED ORDER — PHENYLEPHRINE 40 MCG/ML (10ML) SYRINGE FOR IV PUSH (FOR BLOOD PRESSURE SUPPORT)
PREFILLED_SYRINGE | INTRAVENOUS | Status: DC | PRN
  Administered 2019-08-09 (×2): 80 ug via INTRAVENOUS

## 2019-08-09 MED ORDER — ACETAMINOPHEN 500 MG PO TABS: 1000.0000 mg | ORAL_TABLET | Freq: Once | ORAL | Status: DC

## 2019-08-09 MED ORDER — ONDANSETRON HCL 4 MG/2ML IJ SOLN
INTRAMUSCULAR | Status: AC
  Filled 2019-08-09: qty 2

## 2019-08-09 MED ORDER — EPHEDRINE SULFATE-NACL 50-0.9 MG/10ML-% IV SOSY
PREFILLED_SYRINGE | INTRAVENOUS | Status: DC | PRN
  Administered 2019-08-09: 10 mg via INTRAVENOUS

## 2019-08-09 MED ORDER — DEXAMETHASONE SODIUM PHOSPHATE 10 MG/ML IJ SOLN
INTRAMUSCULAR | Status: AC
  Filled 2019-08-09: qty 1

## 2019-08-09 MED ORDER — ONDANSETRON HCL 4 MG/2ML IJ SOLN
INTRAMUSCULAR | Status: DC | PRN
  Administered 2019-08-09: 4 mg via INTRAVENOUS

## 2019-08-09 MED ORDER — SUCCINYLCHOLINE CHLORIDE 200 MG/10ML IV SOSY
PREFILLED_SYRINGE | INTRAVENOUS | Status: DC | PRN
  Administered 2019-08-09: 140 mg via INTRAVENOUS

## 2019-08-09 MED ORDER — PROPOFOL 10 MG/ML IV BOLUS
INTRAVENOUS | Status: AC
  Filled 2019-08-09: qty 20

## 2019-08-09 MED ORDER — LACTATED RINGERS IV SOLN
INTRAVENOUS | Status: DC
  Administered 2019-08-09: 13:00:00 via INTRAVENOUS

## 2019-08-09 MED ORDER — DEXAMETHASONE SODIUM PHOSPHATE 10 MG/ML IJ SOLN
INTRAMUSCULAR | Status: DC | PRN
  Administered 2019-08-09: 4 mg via INTRAVENOUS

## 2019-08-09 MED ORDER — LIDOCAINE 2% (20 MG/ML) 5 ML SYRINGE
INTRAMUSCULAR | Status: DC | PRN
  Administered 2019-08-09: 80 mg via INTRAVENOUS

## 2019-08-09 MED ORDER — SODIUM CHLORIDE 0.9 % IR SOLN
Status: DC | PRN
  Administered 2019-08-09: 3000 mL via INTRAVESICAL

## 2019-08-09 MED ORDER — FENTANYL CITRATE (PF) 100 MCG/2ML IJ SOLN
INTRAMUSCULAR | Status: AC
  Filled 2019-08-09: qty 2

## 2019-08-09 MED ORDER — LIDOCAINE 2% (20 MG/ML) 5 ML SYRINGE
INTRAMUSCULAR | Status: AC
  Filled 2019-08-09: qty 5

## 2019-08-09 MED ORDER — FENTANYL CITRATE (PF) 100 MCG/2ML IJ SOLN: 25.0000 ug | INTRAMUSCULAR | Status: DC | PRN

## 2019-08-09 MED ORDER — SENNOSIDES-DOCUSATE SODIUM 8.6-50 MG PO TABS: 1.0000 | ORAL_TABLET | Freq: Two times a day (BID) | ORAL | 0 refills | Status: DC

## 2019-08-09 MED ORDER — CEPHALEXIN 250 MG PO CAPS: 250.0000 mg | ORAL_CAPSULE | Freq: Two times a day (BID) | ORAL | 0 refills | Status: AC

## 2019-08-09 MED ORDER — OXYCODONE-ACETAMINOPHEN 5-325 MG PO TABS: 1.0000 | ORAL_TABLET | Freq: Three times a day (TID) | ORAL | 0 refills | Status: DC | PRN

## 2019-08-09 MED ORDER — SUCCINYLCHOLINE CHLORIDE 200 MG/10ML IV SOSY
PREFILLED_SYRINGE | INTRAVENOUS | Status: AC
  Filled 2019-08-09: qty 10

## 2019-08-09 MED ORDER — SODIUM CHLORIDE 0.9 % IV SOLN
2.0000 g | INTRAVENOUS | Status: AC
  Administered 2019-08-09: 15:00:00 2 g via INTRAVENOUS
  Filled 2019-08-09: qty 20

## 2019-08-09 MED ORDER — PROPOFOL 10 MG/ML IV BOLUS
INTRAVENOUS | Status: DC | PRN
  Administered 2019-08-09: 100 mg via INTRAVENOUS

## 2019-08-09 SURGICAL SUPPLY — 23 items
BAG URO CATCHER STRL LF (MISCELLANEOUS) ×3 IMPLANT
BASKET LASER NITINOL 1.9FR (BASKET) ×3 IMPLANT
CATH INTERMIT  6FR 70CM (CATHETERS) ×3 IMPLANT
CLOTH BEACON ORANGE TIMEOUT ST (SAFETY) ×3 IMPLANT
COVER SURGICAL LIGHT HANDLE (MISCELLANEOUS) ×3 IMPLANT
COVER WAND RF STERILE (DRAPES) IMPLANT
EXTRACTOR STONE 1.7FRX115CM (UROLOGICAL SUPPLIES) IMPLANT
FIBER LASER FLEXIVA 1000 (UROLOGICAL SUPPLIES) IMPLANT
FIBER LASER FLEXIVA 365 (UROLOGICAL SUPPLIES) IMPLANT
FIBER LASER FLEXIVA 550 (UROLOGICAL SUPPLIES) IMPLANT
FIBER LASER TRAC TIP (UROLOGICAL SUPPLIES) IMPLANT
GLOVE BIOGEL M STRL SZ7.5 (GLOVE) ×3 IMPLANT
GOWN STRL REUS W/TWL LRG LVL3 (GOWN DISPOSABLE) ×3 IMPLANT
GUIDEWIRE ANG ZIPWIRE 038X150 (WIRE) ×3 IMPLANT
GUIDEWIRE STR DUAL SENSOR (WIRE) ×3 IMPLANT
KIT TURNOVER KIT A (KITS) IMPLANT
MANIFOLD NEPTUNE II (INSTRUMENTS) ×3 IMPLANT
PACK CYSTO (CUSTOM PROCEDURE TRAY) ×3 IMPLANT
SHEATH URETERAL 12FRX28CM (UROLOGICAL SUPPLIES) IMPLANT
SHEATH URETERAL 12FRX35CM (MISCELLANEOUS) ×3 IMPLANT
TUBE FEEDING 8FR 16IN STR KANG (MISCELLANEOUS) ×3 IMPLANT
TUBING CONNECTING 10 (TUBING) ×2 IMPLANT
TUBING CONNECTING 10' (TUBING) ×1

## 2019-08-09 NOTE — Discharge Instructions (Signed)
1 - You may have urinary urgency (bladder spasms) and bloody urine on / off with stent in place. This is normal.  2 - Remove tethered stents on Monday morning at home by pulling on strings, then blue-white plastic tubing and discarding. There are TWO stents. Office is open Monday if any problems arise.   3 - Call MD or go to ER for fever >102, severe pain / nausea / vomiting not relieved by medications, or acute change in medical status

## 2019-08-09 NOTE — Anesthesia Preprocedure Evaluation (Addendum)
Anesthesia Evaluation  Patient identified by MRN, date of birth, ID band Patient awake    Reviewed: Allergy & Precautions, NPO status , Patient's Chart, lab work & pertinent test results  Airway Mallampati: II  TM Distance: >3 FB Neck ROM: Full    Dental no notable dental hx. (+) Teeth Intact, Dental Advisory Given   Pulmonary neg pulmonary ROS,  COVID positive 07/12/19, asymptomatic today   Pulmonary exam normal breath sounds clear to auscultation       Cardiovascular hypertension, Normal cardiovascular exam Rhythm:Regular Rate:Normal  HLD   Neuro/Psych negative neurological ROS  negative psych ROS   GI/Hepatic negative GI ROS, Neg liver ROS,   Endo/Other  diabetes, Type 2, Oral Hypoglycemic AgentsHypothyroidism   Renal/GU Renal InsufficiencyRenal disease  negative genitourinary   Musculoskeletal  (+) Arthritis ,   Abdominal   Peds  Hematology negative hematology ROS (+)   Anesthesia Other Findings   Reproductive/Obstetrics                            Anesthesia Physical Anesthesia Plan  ASA: III  Anesthesia Plan: General   Post-op Pain Management:    Induction: Intravenous  PONV Risk Score and Plan: 2 and Midazolam, Dexamethasone and Ondansetron  Airway Management Planned: Oral ETT  Additional Equipment:   Intra-op Plan:   Post-operative Plan: Extubation in OR  Informed Consent: I have reviewed the patients History and Physical, chart, labs and discussed the procedure including the risks, benefits and alternatives for the proposed anesthesia with the patient or authorized representative who has indicated his/her understanding and acceptance.     Dental advisory given  Plan Discussed with: CRNA  Anesthesia Plan Comments:         Anesthesia Quick Evaluation

## 2019-08-09 NOTE — H&P (Signed)
Donald King is an 63 y.o. male.    Chief Complaint: Pre-Op BILATERAL Ureteroscopic Stone Manipulation  HPI:   1 - Left Ureteral / Right Renal Stones - 66mm left prox ureteral stone with mild hydro, Rt lower pole non-obstructing stone abtou 68mm on ER CT 06/2019 on eval left flank pain. Underwent urgent stenting 07/12/19 with bilateral 6x24 contour stents.   2 - Acute on Chronic Renal Failure - Cr 3.8 with , unilateral left  Hydro. He is variably compliant diabetic. Last Cr on record 1.5 2016. Minimal change after stenting.   3 -  Prostate Screening - No FHX prostate cancer  PMH sig for Appy, DM2. No CV disease / blood thinners.   Today "Donald King" is seen to proceed with BILATERAL ureteroscopic stone manipulation. NO interval fevers.  Most recent Cr 2.9. He is C19 POSITIVE screen last month, asymptomatic.    Past Medical History:  Diagnosis Date  . Diabetes mellitus without complication ([place])   . Hypertension   . Hypothyroidism     Past Surgical History:  Procedure Laterality Date  . APPENDECTOMY  1966  . CYSTOSCOPY W/ URETERAL STENT PLACEMENT Bilateral 07/12/2019   Procedure: CYSTOSCOPY WITH RETROGRADE AND URETERAL STENT PLACEMENT;  Surgeon: Alexis Frock, MD;  Location: [place];  Service: Urology;  Laterality: Bilateral;  . URETEROSCOPY Left 07/12/2019   Procedure: LEFT DIAGNOSTIC URETEROSCOPY;  Surgeon: Alexis Frock, MD;  Location: [place];  Service: Urology;  Laterality: Left;    Family History  Problem Relation Age of Onset  . Stroke Mother   . Heart disease Mother   . Kidney disease Mother   . Hypertension Mother   . Diabetes Mother   . Cancer Mother   . Alcohol abuse Father    Social History:  reports that he has never smoked. He has never used smokeless tobacco. He reports that he does not drink alcohol or use drugs.  Allergies: No Known Allergies  No medications prior to admission.    No results found for this or any previous visit (from the past 48  hour(s)). No results found.  Review of Systems  Constitutional: Negative for chills and fever.  All other systems reviewed and are negative.   There were no vitals taken for this visit. Physical Exam  Constitutional: He appears well-developed.  HENT:  Head: Normocephalic.  Eyes: Pupils are equal, round, and reactive to light.  Neck: Normal range of motion.  Cardiovascular: Normal rate.  Respiratory: Effort normal.  GI: Soft.  Genitourinary:    Genitourinary Comments: Minimal CVAT at present.    Musculoskeletal: Normal range of motion.  Neurological: He is alert.  Skin: Skin is warm.  Psychiatric: He has a normal mood and affect.     Assessment/Plan Proceed as planned with BILATERAL ureteroscoic stone manipulaiton with goal of stone free. Risks, benefits, alternatives, expected peri-op course discussed previously and reiterated today.   Alexis Frock, MD 08/09/2019, 5:42 AM

## 2019-08-09 NOTE — Brief Op Note (Signed)
08/09/2019  3:53 PM  PATIENT:  Donald King  63 y.o. male  PRE-OPERATIVE DIAGNOSIS:  BILATERAL RENAL / URETERAL STONES  POST-OPERATIVE DIAGNOSIS:  BILATERAL RENAL / URETERAL STONES  PROCEDURE:  Procedure(s) with comments: CYSTOSCOPY WITH RETROGRADE PYELOGRAM, URETEROSCOPY AND STENT PLACEMENT (Bilateral) - 75 MINS HOLMIUM LASER APPLICATION (Bilateral)  SURGEON:  Surgeon(s) and Role:    Alexis Frock, MD - Primary  PHYSICIAN ASSISTANT:   ASSISTANTS: none   ANESTHESIA:   general  EBL:  minimal   BLOOD ADMINISTERED:none  DRAINS: none   LOCAL MEDICATIONS USED:  NONE  SPECIMEN:  Source of Specimen:  bilateral renal / ureteral stone fragments  DISPOSITION OF SPECIMEN:  [facility] for compositional analysis  COUNTS:  YES  TOURNIQUET:  * No tourniquets in log *  DICTATION: .Other Dictation: Dictation Number [id number]  PLAN OF CARE: Discharge to home after PACU  PATIENT DISPOSITION:  PACU - hemodynamically stable.   Delay start of Pharmacological VTE agent (>24hrs) due to surgical blood loss or risk of bleeding: not applicable

## 2019-08-09 NOTE — Anesthesia Postprocedure Evaluation (Signed)
Anesthesia Post Note  Patient: Donald King  Procedure(s) Performed: CYSTOSCOPY WITH RETROGRADE PYELOGRAM, URETEROSCOPY AND STENT PLACEMENT (Bilateral ) HOLMIUM LASER APPLICATION (Bilateral )     Patient location during evaluation: PACU Anesthesia Type: General Level of consciousness: awake and alert Pain management: pain level controlled Vital Signs Assessment: post-procedure vital signs reviewed and stable Respiratory status: spontaneous breathing, nonlabored ventilation, respiratory function stable and patient connected to nasal cannula oxygen Cardiovascular status: blood pressure returned to baseline and stable Postop Assessment: no apparent nausea or vomiting Anesthetic complications: no    Last Vitals:  Vitals:   08/09/19 1630 08/09/19 1645  BP: (!) 164/93 (!) 169/91  Pulse: 85 74  Resp: 17   Temp: (!) 36.4 C 36.5 C  SpO2: 99% 99%    Last Pain:  Vitals:   08/09/19 1645  TempSrc:   PainSc: 0-No pain                 Chelsey L Woodrum

## 2019-08-09 NOTE — Anesthesia Procedure Notes (Signed)
Procedure Name: Intubation Date/Time: 08/09/2019 2:58 PM Performed by: Mitzie Na, CRNA Pre-anesthesia Checklist: Patient identified, Emergency Drugs available, Suction available and Patient being monitored Patient Re-evaluated:Patient Re-evaluated prior to induction Oxygen Delivery Method: Circle system utilized Preoxygenation: Pre-oxygenation with 100% oxygen Induction Type: IV induction and Rapid sequence Laryngoscope Size: Mac and 3 Grade View: Grade I Tube type: Oral Tube size: 7.5 mm Number of attempts: 1 Airway Equipment and Method: Stylet and Oral airway Placement Confirmation: ETT inserted through vocal cords under direct vision,  positive ETCO2 and breath sounds checked- equal and bilateral Secured at: 22 cm Tube secured with: Tape Dental Injury: Teeth and Oropharynx as per pre-operative assessment

## 2019-08-09 NOTE — Transfer of Care (Signed)
Immediate Anesthesia Transfer of Care Note  Patient: [name]  Procedure(s) Performed: CYSTOSCOPY WITH RETROGRADE PYELOGRAM, URETEROSCOPY AND STENT PLACEMENT (Bilateral ) HOLMIUM LASER APPLICATION (Bilateral )  Patient Location: PACU  Anesthesia Type:General  Level of Consciousness: awake, alert , oriented and patient cooperative  Airway & Oxygen Therapy: Patient Spontanous Breathing and Patient connected to face mask oxygen  Post-op Assessment: Report given to RN, Post -op Vital signs reviewed and stable and Patient moving all extremities  Post vital signs: Reviewed and stable  Last Vitals:  Vitals Value Taken Time  BP 166/91 08/09/19 1608  Temp    Pulse 94 08/09/19 1610  Resp 27 08/09/19 1610  SpO2 100 % 08/09/19 1610  Vitals shown include unvalidated device data.  Last Pain:  Vitals:   08/09/19 1229  TempSrc:   PainSc: 3       Patients Stated Pain Goal: 3 ([id number])  Complications: No apparent anesthesia complications

## 2019-08-10 ENCOUNTER — Encounter (HOSPITAL_COMMUNITY): Payer: Self-pay | Admitting: Urology

## 2019-08-10 NOTE — Op Note (Signed)
Donald King, Donald King MEDICAL RECORD [id number] ACCOUNT [id number] DATE OF BIRTH:December 17, 1955 FACILITY: [facility] LOCATION: [facility] PHYSICIAN:Esten Dollar, MD  OPERATIVE REPORT  DATE OF PROCEDURE:  08/09/2019  PREOPERATIVE DIAGNOSIS:  Left ureteral bilateral renal stones, renal insufficiency.  PROCEDURE: 1.  Cystoscopy, bilateral retrograde pyelograms, interpretation. 2.  Bilateral ureteroscopy with laser lithotripsy. 3.  Exchange of bilateral ureteral stents 5 x 24 Polaris with tether.  ESTIMATED BLOOD LOSS:  Nil.  COMPLICATIONS:  None.  SPECIMEN:  Bilateral renal and ureteral stone fragments for composition analysis.  FINDINGS: 1.  Left proximal ureteral stone. 2.  Right midpole renal stone. 3.  Successful ablation and removal of all accessible stone fragments larger than 130 mm bilaterally. 4.  Successful replacement of bilateral ureteral stents, proximal end renal pelvis, distal end urinary bladder.  INDICATIONS:  The patient is a pleasant 63 year old man who was found on workup of acute on chronic renal insufficiency to have left ureteral bilateral renal stones late last month.  He also is an asymptomatic COVID carrier.  He underwent urgent stenting  at that time for renal decompression.  He unfortunately had minimal change to his GFR, suggesting longstanding renal compromise, likely more medical renal disease.  He presents now today for definitive stone management with ureteroscopy with goal of  stone free.  Informed consent was obtained and placed in the medical record.  He continues to exhibit no pulmonary symptoms, fevers.  PROCEDURE IN DETAIL:  The patient being identified, procedure being bilateral ureteroscopic stimulation was confirmed.  Procedure timeout was performed.  Intravenous antibiotics were administered.  General anesthesia was induced.  The patient was placed  into a low lithotomy position.  A sterile field was created by prepping and draping the base of the  penis, perineum, and proximal thighs using iodine.  Cystourethroscopy was performed with a 21-French cystoscope with offset lens.  Inspection of anterior  and posterior urethra was unremarkable.  Inspection of bladder revealed distal end of the ureteral stents in situ.  Each was sequentially grasped and brought to the level of the urethral meatus.  There was a 0.038 ZIPwire that was advanced to the level  of the lower pole bilaterally, and retrograde pyelograms were obtained.  Right retrograde pyelogram demonstrated a single right ureter with a single-system right kidney.  No filling defects or narrowing noted.  Next, left retrograde pyelogram was  obtained.  Left retrograde pyelogram demonstrated a single left ureter with single-system left kidney.  There was a filling defect consistent with known stone in the proximal ureter.  A ZIPwire was once again advanced and set aside as safety wire.  An 8-French  feeding tube was placed in the urinary bladder for pressure release, and semirigid ureteroscopy performed of the distal four-fifths of the right ureter alongside a separate sensor working wire.  No mucosal abnormalities were found.  Similarly, a  semirigid ureteroscopy was performed of the distal four-fifths of the left ureter alongside a separate sensor working wire.  No mucosal abnormalities were found.  The semirigid scope was then exchanged for a 12/14 medium-length ureteral access sheath  using continuous fluoroscopic guidance to the level of the proximal ureter, and systematic inspection was performed of the left proximal ureter and left kidney.  Using a single channel digital ureteroscope in the proximal ureter, there was a stone as  anticipated.  This was too large for simple basketing.  Holmium laser energy was applied to the stone using settings of 0.2 joules and 20 Hz.  These were fragmented into approximately  4 smaller pieces that were then sequentially grasped with their long  axis, removed,  and set aside for composition analysis.  Panendoscopic examination of all calices and left kidney revealed no distal calcifications.  Access sheath was removed under continuous vision.  No significant mucosal abnormalities were found.  The  access sheath was then placed over the right working wire to the level of the proximal right ureter using continuous fluoroscopic guidance, and flexible ureteroscopy was performed of the proximal right ureter and right kidney.  There was a dominant  midpole calcification that was too large for simple basketing, as expected.  Holmium laser energy applied at a setting of 0.2 joules and 20 Hz.  This was fragmented into approximately 3 smaller pieces.  They were grasped on their long axis and removed  and set aside for composition analysis.  Their access sheath was removed under continuous vision.  No significant mucosal abnormalities were found.  Given the bilateral nature of the procedure and the patient's known baseline renal insufficiency, it was  felt that brief interval stenting with tethered stents would be warranted.  As such, new 5 x 24 Polaris-type stents were placed with remaining safety wire using fluoroscopic guidance.  Good proximal and distal planes were noted.  The procedure was  terminated.  The patient tolerated the procedure well.  No immediate perioperative complications.  The patient was taken to postanesthesia care in stable condition with plan for discharge home afterwards.  LN/NUANCE  D:08/09/2019 T:08/10/2019 JOB:[id number]

## 2019-08-27 LAB — ACID FAST CULTURE WITH REFLEXED SENSITIVITIES (MYCOBACTERIA): Acid Fast Culture: NEGATIVE

## 2019-10-01 ENCOUNTER — Ambulatory Visit: Payer: Self-pay | Admitting: Family

## 2019-10-23 ENCOUNTER — Encounter (INDEPENDENT_AMBULATORY_CARE_PROVIDER_SITE_OTHER): Payer: Medicaid Other | Admitting: Primary Care

## 2019-10-23 ENCOUNTER — Encounter (INDEPENDENT_AMBULATORY_CARE_PROVIDER_SITE_OTHER): Payer: Self-pay

## 2019-10-23 NOTE — Progress Notes (Signed)
Virtual Visit via Telephone Note  I connected with Donald King on 10/23/19 at 10:10 AM EST by telephone and verified that I am speaking with the correct person using two identifiers.   I discussed the limitations, risks, security and privacy concerns of performing an evaluation and management service by telephone and the availability of in person appointments. I also discussed with the patient that there may be a patient responsible charge related to this service. The patient expressed understanding and agreed to proceed.   History of Present Illness:    Observations/Objective:   Assessment and Plan:   Follow Up Instructions:    I discussed the assessment and treatment plan with the patient. The patient was provided an opportunity to ask questions and all were answered. The patient agreed with the plan and demonstrated an understanding of the instructions.   The patient was advised to call back or seek an in-person evaluation if the symptoms worsen or if the condition fails to improve as anticipated.  I provided 0 minutes of non-face-to-face time during this encounter.   Kerin Perna, NP

## 2019-11-27 ENCOUNTER — Other Ambulatory Visit: Payer: Self-pay | Admitting: Cardiology

## 2019-11-27 DIAGNOSIS — Z20822 Contact with and (suspected) exposure to covid-19: Secondary | ICD-10-CM

## 2019-11-28 LAB — NOVEL CORONAVIRUS, NAA: SARS-CoV-2, NAA: NOT DETECTED

## 2019-12-11 ENCOUNTER — Encounter: Payer: Self-pay | Admitting: Family Medicine

## 2019-12-11 ENCOUNTER — Ambulatory Visit (INDEPENDENT_AMBULATORY_CARE_PROVIDER_SITE_OTHER): Payer: 59 | Admitting: Family Medicine

## 2019-12-11 DIAGNOSIS — M545 Low back pain: Secondary | ICD-10-CM

## 2019-12-11 DIAGNOSIS — E039 Hypothyroidism, unspecified: Secondary | ICD-10-CM

## 2019-12-11 DIAGNOSIS — E1122 Type 2 diabetes mellitus with diabetic chronic kidney disease: Secondary | ICD-10-CM | POA: Diagnosis not present

## 2019-12-11 DIAGNOSIS — E782 Mixed hyperlipidemia: Secondary | ICD-10-CM

## 2019-12-11 DIAGNOSIS — G8929 Other chronic pain: Secondary | ICD-10-CM

## 2019-12-11 DIAGNOSIS — D509 Iron deficiency anemia, unspecified: Secondary | ICD-10-CM

## 2019-12-11 DIAGNOSIS — I10 Essential (primary) hypertension: Secondary | ICD-10-CM | POA: Diagnosis not present

## 2019-12-11 DIAGNOSIS — N200 Calculus of kidney: Secondary | ICD-10-CM

## 2019-12-11 NOTE — Progress Notes (Signed)
Virtual Visit via Video Note  I connected with Donald King on 12/11/19 at  1:30 PM EST by a video enabled telemedicine application and verified that I am speaking with the correct person using two identifiers. Location patient: home Location provider: home office Persons participating in the virtual visit: patient, provider  I discussed the limitations of evaluation and management by telemedicine and the availability of in person appointments. The patient expressed understanding and agreed to proceed.  Chief Complaint  Patient presents with  . New Patient (Initial Visit)    Patient agrees to virtual visit. Per chart he was seen at [facility] on 9.11.20 and advised to D/C Tamsulosin, start Keflex, percocet, and senokot after stent placement for 80mm right nephrolithiasis. CBC and FE & TIBC were quite off as well. Was referred to [facility] for this and Acute Chronic Renal Failure.  . Diabetes    He is needing to F/U with DM.  A1C last completed 52-months-ago at [facility] and had risin to 8.0.  He currently takes Metformin 1k mg 1qam for this.  . Hypothyroidism    He is also wanting to F/U with Hypothyroidism. Per chart his TSH was last checked on 9.26.2016 being 0.29 but FT4 in range at 1.10.We have no recent records.     HPI: Donald King is a 64 y.o. male to establish care with our office. He has not seen a PCP in a few years. He was seen in 06/2019 and 07/2019 in the ER for kidney stones. He was referred to [facility] for this as well as his renal insufficiency. He is unsure of the name of the doctor he sees at [place].   Pt has DM - last A1C = 8.0 in 07/2019. He is taking metformin 1000mg  daily but states maybe 1-2 days per week he takes it BID. Home BS fasting 120-180 and post-prandial 200+. He states he was on glipizide but ran out 4-5 days ago. Chart review shows pt on glipizide XL 10mg  daily but this was d/c'd at appt on 06/24/19.  Lab Results  Component Value Date   HGBA1C 8.0 (H)  08/06/2019   Pt also has a h/o HTN for which he takes lisinopril 10mg  daily, and well as hypothyroidism for which he takes levothyroxine 150mcg daily.  Lab Results  Component Value Date   BUN 11 08/09/2019   Lab Results  Component Value Date   CREATININE 2.68 (H) 08/09/2019   Lab Results  Component Value Date   NA 140 08/09/2019   K 4.4 08/09/2019   CL 117 (H) 08/09/2019   CO2 20 (L) 08/09/2019   Lab Results  Component Value Date   TSH 0.29 (L) 08/24/2015    Pt also wants to go back on oxycodone 10mg  TID which he states he was on for years for his chronic back pain but stopped when he was incarcerated x 4 years. He was released in 05/2019. 2016 lumbar xray showed narrowing of the L4-L5 disc space and degenerative spondylosis. At that 05/2019 appt, pt was not interested in exercises, PT and had states NSAIDs and tylenol both not effective.   Past Medical History:  Diagnosis Date  . Diabetes mellitus without complication ([place])   . Hypertension   . Hypothyroidism     Past Surgical History:  Procedure Laterality Date  . APPENDECTOMY  1966  . CYSTOSCOPY W/ URETERAL STENT PLACEMENT Bilateral 07/12/2019   Procedure: CYSTOSCOPY WITH RETROGRADE AND URETERAL STENT PLACEMENT;  Surgeon: Alexis Frock, MD;  Location: [place];  Service: Urology;  Laterality: Bilateral;  . CYSTOSCOPY WITH RETROGRADE PYELOGRAM, URETEROSCOPY AND STENT PLACEMENT Bilateral 08/09/2019   Procedure: CYSTOSCOPY WITH RETROGRADE PYELOGRAM, URETEROSCOPY AND STENT PLACEMENT;  Surgeon: Alexis Frock, MD;  Location: [facility];  Service: Urology;  Laterality: Bilateral;  75 MINS  . HOLMIUM LASER APPLICATION Bilateral [id number]   Procedure: HOLMIUM LASER APPLICATION;  Surgeon: Alexis Frock, MD;  Location: [facility];  Service: Urology;  Laterality: Bilateral;  . URETEROSCOPY Left 07/12/2019   Procedure: LEFT DIAGNOSTIC URETEROSCOPY;  Surgeon: Alexis Frock, MD;  Location: [place];  Service: Urology;  Laterality: Left;     Family History  Problem Relation Age of Onset  . Stroke Mother   . Heart disease Mother   . Kidney disease Mother   . Hypertension Mother   . Diabetes Mother   . Cancer Mother   . Alcohol abuse Father     Social History   Tobacco Use  . Smoking status: Never Smoker  . Smokeless tobacco: Never Used  Substance Use Topics  . Alcohol use: No  . Drug use: No    Comment: cocaine 20 years ago (since 2016)     Current Outpatient Medications:  .  ferrous sulfate 325 (65 FE) MG tablet, Take 1 tablet (325 mg total) by mouth daily. (Patient not taking: Reported on 07/30/2019), Disp: 30 tablet, Rfl: 0 .  levothyroxine (SYNTHROID, LEVOTHROID) 112 MCG tablet, TAKE ONE TABLET BY MOUTH ONCE DAILY (Patient taking differently: Take 112 mcg by mouth daily before breakfast. ), Disp: 30 tablet, Rfl: 4 .  lisinopril (ZESTRIL) 10 MG tablet, Take 10 mg by mouth daily., Disp: , Rfl:  .  metFORMIN (GLUCOPHAGE) 1000 MG tablet, Take 1,000 mg by mouth daily with breakfast., Disp: , Rfl:  .  senna-docusate (SENOKOT-S) 8.6-50 MG tablet, Take 1 tablet by mouth 2 (two) times daily. While taking strong pain meds to prevent constipation., Disp: 10 tablet, Rfl: 0 .  vitamin B-12 (CYANOCOBALAMIN) 1000 MCG tablet, Take 1 tablet (1,000 mcg total) by mouth daily. (Patient not taking: Reported on 07/30/2019), Disp: 30 tablet, Rfl: 0  No Known Allergies    ROS: See pertinent positives and negatives per HPI.   EXAM:  VITALS per patient if applicable: There were no vitals taken for this visit.  BP Readings from Last 3 Encounters:  08/09/19 (!) 169/91  08/06/19 (!) 149/91  07/15/19 139/76     GENERAL: alert, oriented, and in no acute distress  HEENT: atraumatic, conjunctiva clear, no obvious abnormalities on inspection of external nose and ears  NECK: normal movements of the head and neck  LUNGS: on inspection no signs of respiratory distress, breathing rate appears normal, no obvious gross SOB, gasping  or wheezing, no conversational dyspnea  CV: no obvious cyanosis  PSYCH/NEURO:  speech and thought processing grossly intact   ASSESSMENT AND PLAN:  1. Type 2 diabetes mellitus with chronic kidney disease, without long-term current use of insulin, unspecified CKD stage ([place]) - last A1C = 8.0 in 07/2019 - med regimen unclear as pt is taking metformin 1000mg  daily and glipizide 10mg  daily, but in chart glipizide was d/c'd in 05/2019 - pt also with CKD and had acute on chronic renal insufficiency at time of last labs. Will get new labs and stop metformin if creat > 1.5 - will also wait on A1C result to adjust DM meds - Comprehensive metabolic panel; Future - Hemoglobin A1c; Future - cont to check BS BID and keep log - plan for f/u q84mo  2.  Essential hypertension - pt does not have BP cuff at home but BP elevated during hospitalization in 07/2019 (this was for nephrolithiasis so not sure accuracy of BP readings d/t pain) - Comprehensive metabolic panel; Future - cont lisinopril 10mg  daily - will have BP checked at lab appt on 1/15  3. Hypothyroidism, unspecified type - currently taking levothyroxine 142mcg daily but has not had TFTs done in 4+ years. Will refill or adjust dose once lab results available - TSH; Future - T4, free; Future  4. Mixed hyperlipidemia - not on statin - Lipid panel; Future  5. Nephrolithiasis - treated in 06/2019 and 07/2019 - seen by [facility] - unclear from pt when he is next schedule for f/u  6. Iron deficiency anemia, unspecified iron deficiency anemia type - not currently taking iron supplement - Iron, TIBC and Ferritin Panel; Future - CBC; Future  7. Chronic low back pain - referral to pain management as pt is requesting to resume oxycodone 10mg  TID and I am not comfortable prescribing this    I discussed the assessment and treatment plan with the patient. The patient was provided an opportunity to ask questions and all were answered. The  patient agreed with the plan and demonstrated an understanding of the instructions.   The patient was advised to call back or seek an in-person evaluation if the symptoms worsen or if the condition fails to improve as anticipated.   Letta Median, DO

## 2019-12-12 ENCOUNTER — Other Ambulatory Visit: Payer: 59

## 2019-12-13 ENCOUNTER — Other Ambulatory Visit (INDEPENDENT_AMBULATORY_CARE_PROVIDER_SITE_OTHER): Payer: 59

## 2019-12-13 ENCOUNTER — Other Ambulatory Visit: Payer: Self-pay

## 2019-12-13 DIAGNOSIS — D509 Iron deficiency anemia, unspecified: Secondary | ICD-10-CM

## 2019-12-13 DIAGNOSIS — E039 Hypothyroidism, unspecified: Secondary | ICD-10-CM

## 2019-12-13 DIAGNOSIS — E1122 Type 2 diabetes mellitus with diabetic chronic kidney disease: Secondary | ICD-10-CM

## 2019-12-13 DIAGNOSIS — I10 Essential (primary) hypertension: Secondary | ICD-10-CM | POA: Diagnosis not present

## 2019-12-13 DIAGNOSIS — E782 Mixed hyperlipidemia: Secondary | ICD-10-CM

## 2019-12-13 LAB — COMPREHENSIVE METABOLIC PANEL
ALT: 10 U/L (ref 0–53)
AST: 16 U/L (ref 0–37)
Albumin: 3.9 g/dL (ref 3.5–5.2)
Alkaline Phosphatase: 77 U/L (ref 39–117)
BUN: 14 mg/dL (ref 6–23)
CO2: 23 mEq/L (ref 19–32)
Calcium: 9 mg/dL (ref 8.4–10.5)
Chloride: 108 mEq/L (ref 96–112)
Creatinine, Ser: 2.68 mg/dL — ABNORMAL HIGH (ref 0.40–1.50)
GFR: 29.23 mL/min — ABNORMAL LOW (ref >60.00)
Glucose, Bld: 185 mg/dL — ABNORMAL HIGH (ref 70–99)
Potassium: 4.4 mEq/L (ref 3.5–5.1)
Sodium: 140 mEq/L (ref 135–145)
Total Bilirubin: 0.6 mg/dL (ref 0.2–1.2)
Total Protein: 7.2 g/dL (ref 6.0–8.3)

## 2019-12-13 LAB — CBC
HCT: 30.2 % — ABNORMAL LOW (ref 39.0–52.0)
Hemoglobin: 10.1 g/dL — ABNORMAL LOW (ref 13.0–17.0)
MCHC: 33.3 g/dL (ref 30.0–36.0)
MCV: 93.9 fl (ref 78.0–100.0)
Platelets: 151 10*3/uL (ref 150.0–400.0)
RBC: 3.22 Mil/uL — ABNORMAL LOW (ref 4.22–5.81)
RDW: 14.4 % (ref 11.5–15.5)
WBC: 3.9 10*3/uL — ABNORMAL LOW (ref 4.0–10.5)

## 2019-12-13 LAB — LIPID PANEL
Cholesterol: 183 mg/dL (ref 0–200)
HDL: 40.2 mg/dL (ref >39.00)
LDL Cholesterol: 121 mg/dL — ABNORMAL HIGH (ref 0–99)
NonHDL: 142.83
Total CHOL/HDL Ratio: 5
Triglycerides: 108 mg/dL (ref 0.0–149.0)
VLDL: 21.6 mg/dL (ref 0.0–40.0)

## 2019-12-13 LAB — TSH: TSH: 9.05 u[IU]/mL — ABNORMAL HIGH (ref 0.35–4.50)

## 2019-12-13 LAB — HEMOGLOBIN A1C: Hgb A1c MFr Bld: 7.2 % — ABNORMAL HIGH (ref 4.6–6.5)

## 2019-12-13 LAB — T4, FREE: Free T4: 0.74 ng/dL (ref 0.60–1.60)

## 2019-12-14 LAB — IRON,TIBC AND FERRITIN PANEL
%SAT: 33 % (calc) (ref 20–48)
Ferritin: 20 ng/mL — ABNORMAL LOW (ref 24–380)
Iron: 103 ug/dL (ref 50–180)
TIBC: 308 mcg/dL (calc) (ref 250–425)

## 2019-12-15 IMAGING — CT CT RENAL STONE PROTOCOL
2 of 4 series · 12 of 46 positions shown, 14 images · non-contrast
Comparison: None.
COMPARISON: None.

Addendum:
CLINICAL DATA: Left flank region pain

EXAM:
CT ABDOMEN AND PELVIS WITHOUT CONTRAST
TECHNIQUE: Multidetector CT imaging of the abdomen and pelvis was performed
following the standard protocol without oral or IV contrast.

[Series 3: ap without · axial · non-contrast · 0.64mm/px · z∈[+784,+1144]mm · 9 of 87 slices shown, 11 images]
[im 10/87  soft-tissue]
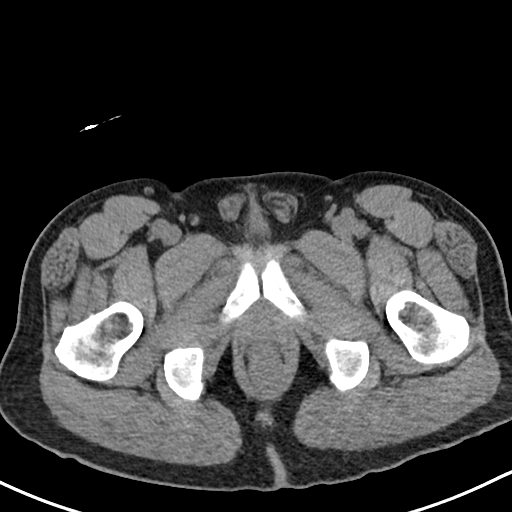
[im 10/87  bone]
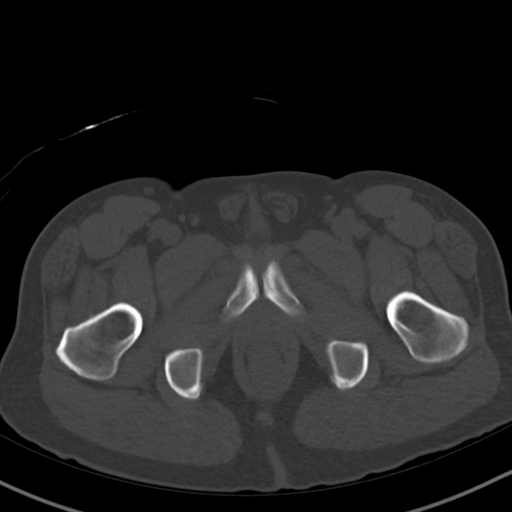
[im 19/87  soft-tissue]
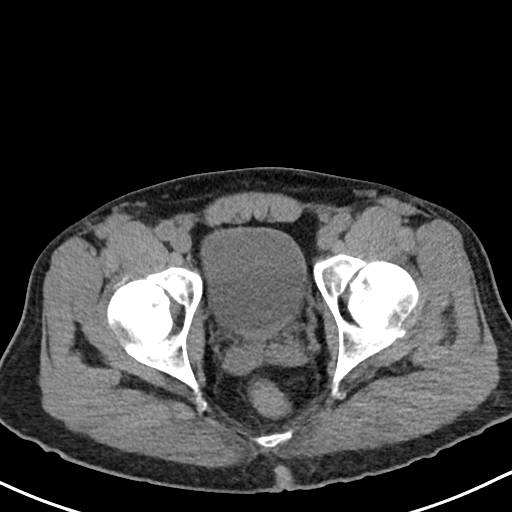
[im 28/87  soft-tissue]
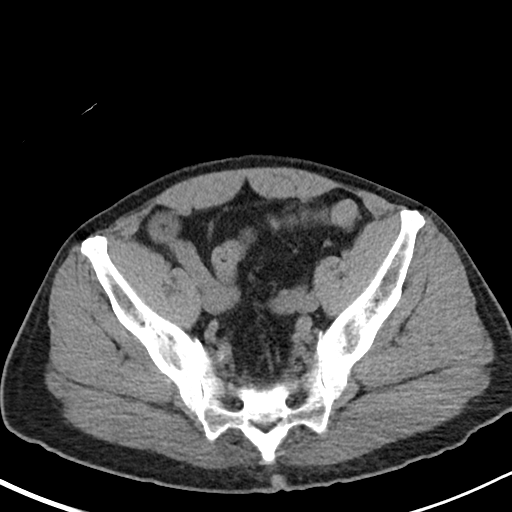
[im 37/87  soft-tissue]
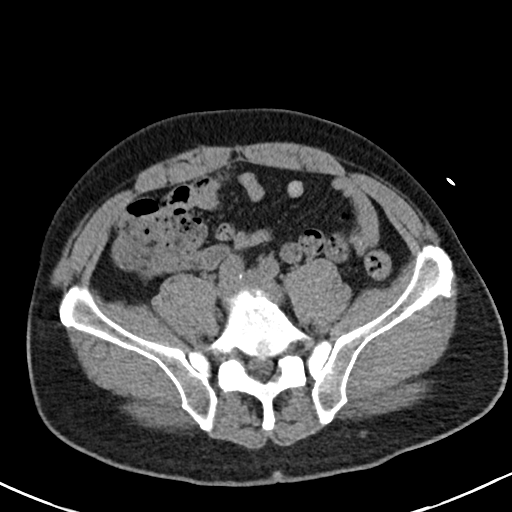
[im 46/87  soft-tissue]
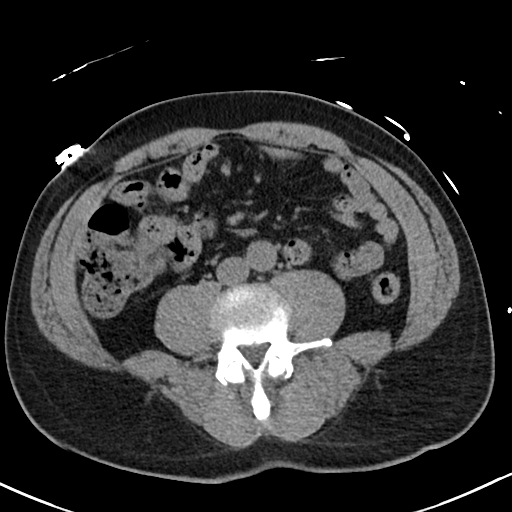
[im 55/87  soft-tissue]
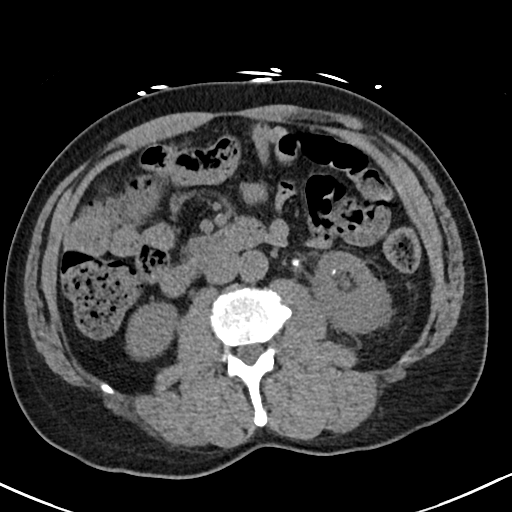
[im 64/87  soft-tissue]
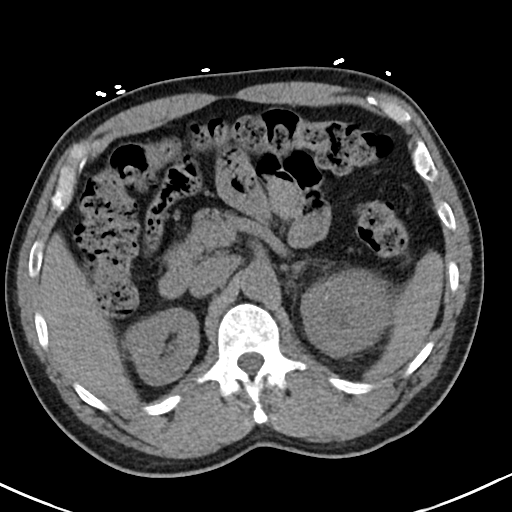
[im 73/87  soft-tissue]
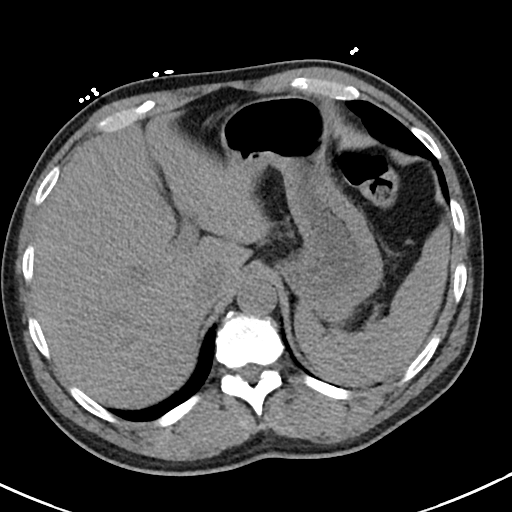
[im 82/87  soft-tissue]
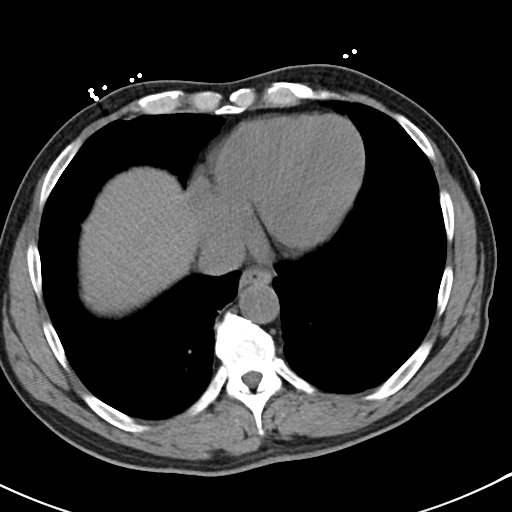
[im 82/87  bone]
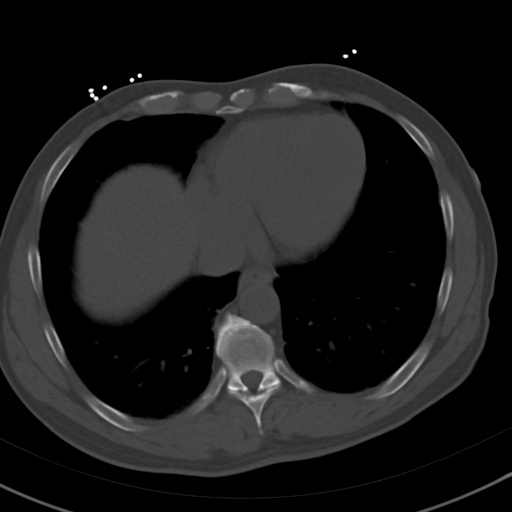

[Series 6: cor · coronal · 0.66mm/px · 3 of 99 slices shown]
[im 33/99  soft-tissue]
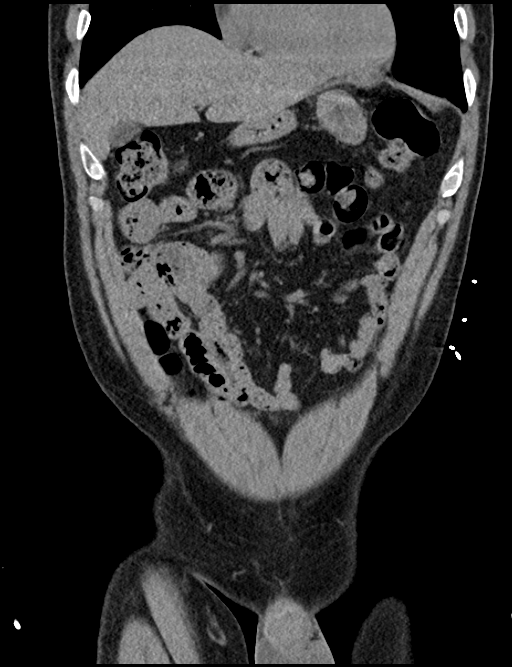
[im 44/99  soft-tissue]
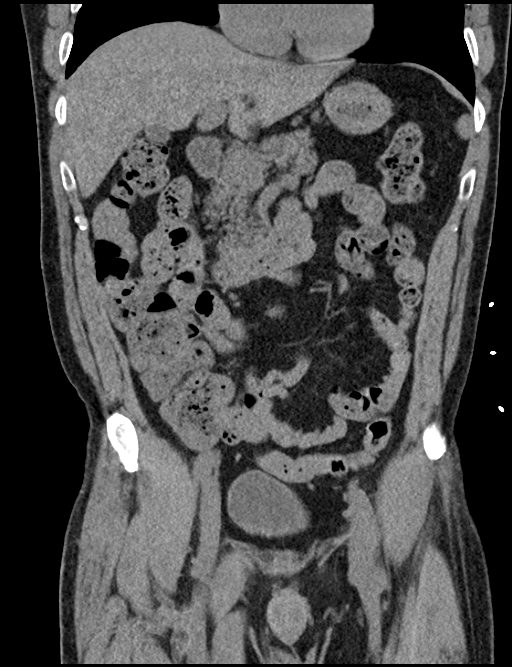
[im 55/99  soft-tissue]
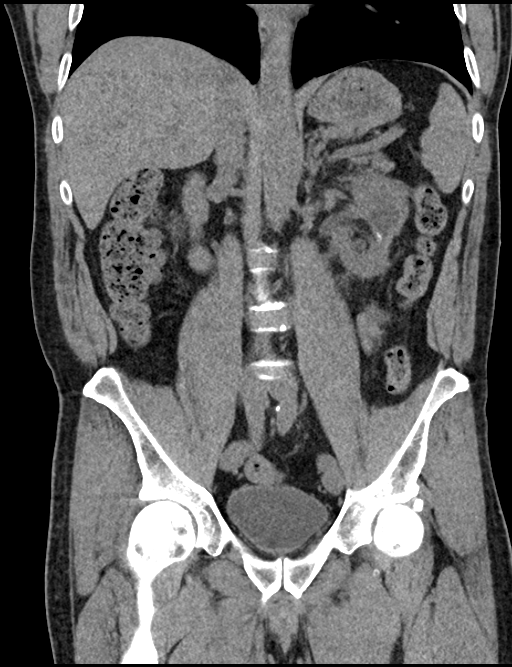

[12 of 46 positions shown; findings below may reference images not displayed]

FINDINGS: Lower chest: There is slight bibasilar atelectasis. Lung bases
otherwise are clear.

Hepatobiliary: There is a tiny calcified granuloma in the right lobe
of the liver toward the dome. No other liver lesions are evident on
this noncontrast enhanced study. Gallbladder wall is not appreciably
thickened. There is no biliary duct dilatation.

Pancreas: No pancreatic mass or inflammatory focus.

Spleen: No splenic lesions are evident.

Adrenals/Urinary Tract: Adrenals bilaterally appear normal. Left
kidney is edematous. There is a cyst in the anterior mid left kidney
measuring 2.0 x 1.9 cm. There is mild to moderate hydronephrosis on
the left. There is no hydronephrosis on the right. There are
scattered calculi in the right kidney measuring in size from as
small as 1 mm to as large as 4 mm. There is a 3 x 3 mm calculus in
the lower pole left kidney. There is a 4 x 1 mm calculus in the mid
left kidney with a nearby 2 mm calculus. There is a 1 mm calculus
more superiorly on the left. There is a calculus in the proximal
left ureter just beyond the ureteropelvic junction measuring 7 x 6
mm. No other ureteral calculi are evident on either side. Urinary
bladder is midline with slight wall thickening along the posterior
aspect. Remainder of the urinary bladder wall appears unremarkable
on this noncontrast enhanced study.

Stomach/Bowel: There is no appreciable bowel wall or mesenteric
thickening. There is moderate stool in the colon. Terminal ileum
appears normal. There is no evident bowel obstruction. No free air
or portal venous air.

Vascular/Lymphatic: There is no abdominal aortic aneurysm. There are
foci of calcification in the distal aorta. No adenopathy is evident
in the abdomen or pelvis.

Reproductive: Prostate and seminal vesicles are normal in size and
contour. There is no evident pelvic mass.

Other: Appendix absent. No periappendiceal region inflammatory
focus. No abscess or ascites is evident in the abdomen or pelvis.

Musculoskeletal: There is degenerative change in the lumbar spine,
most notably at L4-5. There is avascular necrosis in the left
femoral head without appreciable flattening. No blastic or lytic
bone lesions. No intramuscular or abdominal wall lesions are
evident.
IMPRESSION: 1. 7 x 6 mm calculus in the proximal left ureter just beyond the
left ureteropelvic junction. Mild to moderate hydronephrosis noted
on the left.
2. Nonobstructing calculi in each kidney.
3. Mild thickening along the posterior urinary bladder wall which
may represent a degree of cystitis. Most of the urinary bladder wall
has normal thickness.
4. No bowel obstruction. No abscess in the abdomen pelvis. Appendix
absent; no periappendiceal region inflammation.
5. Avascular necrosis in the left femoral head. No appreciable
flattening of the left femoral head.

ADDENDUM:
Add to
IMPRESSION: There is localized thickening along the posterior aspect of the
urinary bladder wall. Question focal cystitis in this area. No
well-defined mass by CT evident in this region. This finding
potentially may warrant direct visualization of the bladder given
the focal nature of the finding.

*** End of Addendum ***
FINDINGS: Lower chest: There is slight bibasilar atelectasis. Lung bases
otherwise are clear.

Hepatobiliary: There is a tiny calcified granuloma in the right lobe
of the liver toward the dome. No other liver lesions are evident on
this noncontrast enhanced study. Gallbladder wall is not appreciably
thickened. There is no biliary duct dilatation.

Pancreas: No pancreatic mass or inflammatory focus.

Spleen: No splenic lesions are evident.

Adrenals/Urinary Tract: Adrenals bilaterally appear normal. Left
kidney is edematous. There is a cyst in the anterior mid left kidney
measuring 2.0 x 1.9 cm. There is mild to moderate hydronephrosis on
the left. There is no hydronephrosis on the right. There are
scattered calculi in the right kidney measuring in size from as
small as 1 mm to as large as 4 mm. There is a 3 x 3 mm calculus in
the lower pole left kidney. There is a 4 x 1 mm calculus in the mid
left kidney with a nearby 2 mm calculus. There is a 1 mm calculus
more superiorly on the left. There is a calculus in the proximal
left ureter just beyond the ureteropelvic junction measuring 7 x 6
mm. No other ureteral calculi are evident on either side. Urinary
bladder is midline with slight wall thickening along the posterior
aspect. Remainder of the urinary bladder wall appears unremarkable
on this noncontrast enhanced study.

Stomach/Bowel: There is no appreciable bowel wall or mesenteric
thickening. There is moderate stool in the colon. Terminal ileum
appears normal. There is no evident bowel obstruction. No free air
or portal venous air.

Vascular/Lymphatic: There is no abdominal aortic aneurysm. There are
foci of calcification in the distal aorta. No adenopathy is evident
in the abdomen or pelvis.

Reproductive: Prostate and seminal vesicles are normal in size and
contour. There is no evident pelvic mass.

Other: Appendix absent. No periappendiceal region inflammatory
focus. No abscess or ascites is evident in the abdomen or pelvis.

Musculoskeletal: There is degenerative change in the lumbar spine,
most notably at L4-5. There is avascular necrosis in the left
femoral head without appreciable flattening. No blastic or lytic
bone lesions. No intramuscular or abdominal wall lesions are
evident.
IMPRESSION: 1. 7 x 6 mm calculus in the proximal left ureter just beyond the
left ureteropelvic junction. Mild to moderate hydronephrosis noted
on the left.
2. Nonobstructing calculi in each kidney.
3. Mild thickening along the posterior urinary bladder wall which
may represent a degree of cystitis. Most of the urinary bladder wall
has normal thickness.
4. No bowel obstruction. No abscess in the abdomen pelvis. Appendix
absent; no periappendiceal region inflammation.
5. Avascular necrosis in the left femoral head. No appreciable
flattening of the left femoral head.

## 2019-12-17 ENCOUNTER — Encounter: Payer: Self-pay | Admitting: Family Medicine

## 2019-12-18 ENCOUNTER — Encounter: Payer: Self-pay | Admitting: Family Medicine

## 2019-12-20 ENCOUNTER — Telehealth (INDEPENDENT_AMBULATORY_CARE_PROVIDER_SITE_OTHER): Payer: 59 | Admitting: Family Medicine

## 2019-12-20 ENCOUNTER — Encounter: Payer: Self-pay | Admitting: Family Medicine

## 2019-12-20 VITALS — Ht 66.0 in | Wt 150.0 lb

## 2019-12-20 DIAGNOSIS — D509 Iron deficiency anemia, unspecified: Secondary | ICD-10-CM

## 2019-12-20 DIAGNOSIS — E039 Hypothyroidism, unspecified: Secondary | ICD-10-CM

## 2019-12-20 DIAGNOSIS — N184 Chronic kidney disease, stage 4 (severe): Secondary | ICD-10-CM

## 2019-12-20 DIAGNOSIS — G8929 Other chronic pain: Secondary | ICD-10-CM

## 2019-12-20 DIAGNOSIS — E1122 Type 2 diabetes mellitus with diabetic chronic kidney disease: Secondary | ICD-10-CM

## 2019-12-20 DIAGNOSIS — M545 Low back pain, unspecified: Secondary | ICD-10-CM

## 2019-12-20 MED ORDER — LEVOTHYROXINE SODIUM 125 MCG PO TABS: 125.0000 ug | ORAL_TABLET | Freq: Every day | ORAL | 1 refills | Status: AC

## 2019-12-20 MED ORDER — GLIPIZIDE 5 MG PO TABS: ORAL_TABLET | ORAL | 1 refills | Status: DC

## 2019-12-20 NOTE — Progress Notes (Signed)
Virtual Visit via Video Note  I connected with Donald King on 12/20/19 at  4:00 PM EST by a video enabled telemedicine application and verified that I am speaking with the correct person using two identifiers. Location patient: home Location provider: work  Persons participating in the virtual visit: patient, provider  I discussed the limitations of evaluation and management by telemedicine and the availability of in person appointments. The patient expressed understanding and agreed to proceed.  Chief Complaint  Patient presents with  . Follow-up    Discuss lab results.     HPI: Donald King is a 64 y.o. male to f/u on recent lab results. Pt is driving during this virtual visit.  1. Hypothyroidism - pt states he is taking levothyroxine 192mcg daily in AM, on empty stomach. Recent TSH significantly elevated so will plan to increase dose to 112mcg daily and recheck TFTs in 6-8 wks Lab Results  Component Value Date   TSH 9.05 (H) 12/13/2019   2. Iron deficiency anemia - pt states he was given an iron supplement to take in 06/2019 at the time of his kidney stone removal but he states he ran out. He is unsure of how long he took this. On review of pts chart, it does not appear he has had EGD/colonoscopy to look for etiology of iron-deficiency. He had hematuria associated with nephrolithiasis but I don't feel this could have been significant enough to cause his anemia and iron def. Will place GI referral for eval  Component     Latest Ref Rng & Units 08/24/2015 07/12/2019 07/13/2019 07/14/2019            5:31 AM  WBC     4.0 - 10.5 K/uL 4.4 5.3 3.3 (L) 2.4 (L)  RBC     4.22 - 5.81 Mil/uL 4.32 3.14 (L) 2.71 (L) 2.37 (L)  Hemoglobin     13.0 - 17.0 g/dL 12.7 (L) 9.3 (L) 8.1 (L) 7.0 (L)  HCT     39.0 - 52.0 % 38.4 (L) 29.7 (L) 24.9 (L) 21.5 (L)  MCV     78.0 - 100.0 fl 88.8 94.6 91.9 90.7  MCH     26.0 - 34.0 pg  29.6 29.9 29.5  MCHC     30.0 - 36.0 g/dL 33.2 31.3 32.5 32.6  RDW  11.5 - 15.5 % 14.4 12.8 12.7 12.4  Platelets     150.0 - 400.0 K/uL 201.0 211 164 143 (L)  nRBC     0.0 - 0.2 %  0.0 0.0 0.0  Neutrophils     %      NEUT#     1.7 - 7.7 K/uL      Lymphocytes     %      Lymphocyte #     0.7 - 4.0 K/uL      Monocytes Relative     %      Monocyte #     0.1 - 1.0 K/uL      Eosinophil     %      Eosinophils Absolute     0.0 - 0.5 K/uL      Basophil     %      Basophils Absolute     0.0 - 0.1 K/uL      Immature Granulocytes     %      Abs Immature Granulocytes     0.00 - 0.07 K/uL      Folate, Hemolysate  Not Estab. ng/mL      Folate, RBC     >498 ng/mL       Component     Latest Ref Rng & Units 07/14/2019  07/15/2019 08/06/2019         7:28 AM     WBC     4.0 - 10.5 K/uL 2.5 (L)  2.2 (L) 5.9  RBC     4.22 - 5.81 Mil/uL 2.40 (L)  2.50 (L) 2.80 (L)  Hemoglobin     13.0 - 17.0 g/dL 7.2 (L)  7.5 (L) 8.1 (L)  HCT     39.0 - 52.0 % 21.7 (L)  22.6 (L) 26.0 (L)  MCV     78.0 - 100.0 fl 90.4  90.4 92.9  MCH     26.0 - 34.0 pg 30.0  30.0 28.9  MCHC     30.0 - 36.0 g/dL 33.2  33.2 31.2  RDW     11.5 - 15.5 % 12.4  12.2 15.1  Platelets     150.0 - 400.0 K/uL 144 (L)  134 (L) 290  nRBC     0.0 - 0.2 % 0.0  0.0 0.0  Neutrophils     % 65     NEUT#     1.7 - 7.7 K/uL 1.6 (L)     Lymphocytes     % 20     Lymphocyte #     0.7 - 4.0 K/uL 0.5 (L)     Monocytes Relative     % 14     Monocyte #     0.1 - 1.0 K/uL 0.4     Eosinophil     % 1     Eosinophils Absolute     0.0 - 0.5 K/uL 0.0     Basophil     % 0     Basophils Absolute     0.0 - 0.1 K/uL 0.0     Immature Granulocytes     % 0     Abs Immature Granulocytes     0.00 - 0.07 K/uL 0.00     Folate, Hemolysate     Not Estab. ng/mL      Folate, RBC     >498 ng/mL       Component     Latest Ref Rng & Units 12/13/2019          WBC     4.0 - 10.5 K/uL 3.9 (L)  RBC     4.22 - 5.81 Mil/uL 3.22 (L)  Hemoglobin     13.0 - 17.0 g/dL 10.1 (L)  HCT     39.0 - 52.0 % 30.2  (L)  MCV     78.0 - 100.0 fl 93.9  MCH     26.0 - 34.0 pg   MCHC     30.0 - 36.0 g/dL 33.3  RDW     11.5 - 15.5 % 14.4  Platelets     150.0 - 400.0 K/uL 151.0  nRBC     0.0 - 0.2 %   Neutrophils     %   NEUT#     1.7 - 7.7 K/uL   Lymphocytes     %   Lymphocyte #     0.7 - 4.0 K/uL   Monocytes Relative     %   Monocyte #     0.1 - 1.0 K/uL   Eosinophil     %   Eosinophils Absolute  0.0 - 0.5 K/uL   Basophil     %   Basophils Absolute     0.0 - 0.1 K/uL   Immature Granulocytes     %   Abs Immature Granulocytes     0.00 - 0.07 K/uL   Folate, Hemolysate     Not Estab. ng/mL   Folate, RBC     >498 ng/mL     3. DM - A1C improved from 8.0 to 7.2 on metformin 1000mg  BID. Unfortunately, pts has CKD and GFR 29.23 with creatinine of 1.68. Pt does not follow with nephro but I will place referral today. Will d/c metformin and resume glipizide 5mg  BID to increase to 10mg  BID in 1-2wks. He is also overdue for eye and foot exam. Pt was incarcerated until about 6 mo ago  4. Pain management - [facility] does not accept his insurance and he has h/o violation of controlled substance agreement (see OV from 08/24/15). Sent pt MyChart message with contact info for [facility] so he can reach out to them an inquire about pain provider in-network  Past Medical History:  Diagnosis Date  . Diabetes mellitus without complication ([place])   . Hypertension   . Hypothyroidism     Past Surgical History:  Procedure Laterality Date  . APPENDECTOMY  1966  . CYSTOSCOPY W/ URETERAL STENT PLACEMENT Bilateral 07/12/2019   Procedure: CYSTOSCOPY WITH RETROGRADE AND URETERAL STENT PLACEMENT;  Surgeon: Alexis Frock, MD;  Location: [place];  Service: Urology;  Laterality: Bilateral;  . CYSTOSCOPY WITH RETROGRADE PYELOGRAM, URETEROSCOPY AND STENT PLACEMENT Bilateral 08/09/2019   Procedure: CYSTOSCOPY WITH RETROGRADE PYELOGRAM, URETEROSCOPY AND STENT PLACEMENT;  Surgeon: Alexis Frock, MD;   Location[facility];  Service: Urology;  Laterality: Bilateral;  75 MINS  . HOLMIUM LASER APPLICATION Bilateral [id number]   Procedure: HOLMIUM LASER APPLICATION;  Surgeon: Alexis Frock, MD;  Location: [facility];  Service: Urology;  Laterality: Bilateral;  . URETEROSCOPY Left 07/12/2019   Procedure: LEFT DIAGNOSTIC URETEROSCOPY;  Surgeon: Alexis Frock, MD;  Location: [place];  Service: Urology;  Laterality: Left;    Family History  Problem Relation Age of Onset  . Stroke Mother   . Heart disease Mother   . Kidney disease Mother   . Hypertension Mother   . Diabetes Mother   . Cancer Mother   . Alcohol abuse Father     Social History   Tobacco Use  . Smoking status: Never Smoker  . Smokeless tobacco: Never Used  Substance Use Topics  . Alcohol use: No  . Drug use: No    Comment: cocaine 20 years ago (since 2016)     Current Outpatient Medications:  .  levothyroxine (SYNTHROID, LEVOTHROID) 112 MCG tablet, TAKE ONE TABLET BY MOUTH ONCE DAILY (Patient taking differently: Take 112 mcg by mouth daily before breakfast. ), Disp: 30 tablet, Rfl: 4 .  lisinopril (ZESTRIL) 10 MG tablet, Take 10 mg by mouth daily., Disp: , Rfl:  .  metFORMIN (GLUCOPHAGE) 1000 MG tablet, Take 1,000 mg by mouth daily with breakfast., Disp: , Rfl:  .  meloxicam (MOBIC) 15 MG tablet, Take 15 mg by mouth daily., Disp: , Rfl:  .  senna-docusate (SENOKOT-S) 8.6-50 MG tablet, Take 1 tablet by mouth 2 (two) times daily. While taking strong pain meds to prevent constipation. (Patient not taking: Reported on 12/20/2019), Disp: 10 tablet, Rfl: 0 .  sildenafil (VIAGRA) 100 MG tablet, Take 100 mg by mouth daily as needed., Disp: , Rfl:   No Known Allergies  ROS: See pertinent positives and negatives per HPI.   EXAM:  VITALS per patient if applicable: Ht 5\' 6"  (1.676 m)   Wt 150 lb (68 kg)   BMI 24.21 kg/m    GENERAL: alert, oriented, in no acute distress  NECK: normal movements of the head and neck  LUNGS:  on inspection no signs of respiratory distress, breathing rate appears normal, no obvious gross SOB, gasping or wheezing, no conversational dyspnea  CV: no obvious cyanosis  PSYCH/NEURO: pleasant and cooperative, peech and thought processing grossly intact   ASSESSMENT AND PLAN:  1. Type 2 diabetes mellitus with stage 4 chronic kidney disease, without long-term current use of insulin (HCC) - A1C improved from 8.0 to 7.2 on metformin 1000mg  BID. Unfortunately, pts has CKD and GFR 29.23 with creatinine of 1.68. Pt does not follow with nephro but  will place referral today. Will d/c metformin and resume glipizide 5mg  BID to increase to 10mg  BID in 1-2wks. Recheck A1C in 8-12 wks at OV - Hemoglobin A1c; Future Rx: - glipiZIDE (GLUCOTROL) 5 MG tablet; 1 tab po BID x 2 wks then increase to 2 tabs po BID  Dispense: 360 tablet; Refill: 1 - Ambulatory referral to Nephrology - [facility] - pt is also overdue for eye exam, foot exam - pt declines statin - tchol at goal, LDL above goal at 121 - will discuss again at [facility] in 8-12 wks   2. Hypothyroidism, unspecified type Increase: - levothyroxine (SYNTHROID) 125 MCG tablet; Take 1 tablet (125 mcg total) by mouth daily.  Dispense: 90 tablet; Refill: 1 - repeat labs in 6-8 wks - T4, free; Future - TSH; Future  3. Chronic bilateral low back pain without sciatica - pt was previously on oxycodone 10mg  TID x 4+ years prior to incarceration. Referral previously placed to pain management as pt does not feel NSAIDs and other non-narcotic pain meds are effective. [facility] does not accept his insurance and he has h/o violation of controlled substance agreement (see OV from 08/24/15). Sent pt MyChart message with contact info for [facility] so he can reach out to them an inquire about pain provider in-network  4. Iron deficiency anemia, unspecified iron deficiency anemia type - pt states he was given an iron supplement to take in 06/2019 at the time  of his kidney stone removal but he states he ran out. He is unsure of how long he took this. On review of pts chart, it does not appear he has had EGD/colonoscopy to look for etiology of iron-deficiency. He had hematuria associated with nephrolithiasis but I don't feel this could have been significant enough to cause his anemia and iron def. Will place GI referral for eval  - Ambulatory referral to Gastroenterology    I discussed the assessment and treatment plan with the patient. The patient was provided an opportunity to ask questions and all were answered. The patient agreed with the plan and demonstrated an understanding of the instructions.   The patient was advised to call back or seek an in-person evaluation if the symptoms worsen or if the condition fails to improve as anticipated.   Letta Median, DO

## 2019-12-20 NOTE — Patient Instructions (Signed)
Health Maintenance Due  Topic Date Due  . COLONOSCOPY  06/25/2006  . OPHTHALMOLOGY EXAM  07/14/2011  . FOOT EXAM  08/23/2016  . INFLUENZA VACCINE  06/29/2019    Depression screen [facility] 2/9 08/24/2015 05/15/2014 12/27/2012  Decreased Interest 0 0 0  Down, Depressed, Hopeless 0 0 0  PHQ - 2 Score 0 0 0

## 2019-12-23 ENCOUNTER — Encounter: Payer: Self-pay | Admitting: Family Medicine

## 2019-12-23 ENCOUNTER — Encounter: Payer: Self-pay | Admitting: Gastroenterology

## 2019-12-27 ENCOUNTER — Other Ambulatory Visit: Payer: Self-pay

## 2019-12-27 ENCOUNTER — Ambulatory Visit (INDEPENDENT_AMBULATORY_CARE_PROVIDER_SITE_OTHER): Payer: 59 | Admitting: Gastroenterology

## 2019-12-27 ENCOUNTER — Encounter: Payer: Self-pay | Admitting: Gastroenterology

## 2019-12-27 VITALS — BP 122/68 | HR 64 | Temp 97.4°F | Ht 66.0 in | Wt 160.4 lb

## 2019-12-27 DIAGNOSIS — Z01818 Encounter for other preprocedural examination: Secondary | ICD-10-CM | POA: Diagnosis not present

## 2019-12-27 DIAGNOSIS — D508 Other iron deficiency anemias: Secondary | ICD-10-CM

## 2019-12-27 DIAGNOSIS — Z1211 Encounter for screening for malignant neoplasm of colon: Secondary | ICD-10-CM | POA: Diagnosis not present

## 2019-12-27 DIAGNOSIS — E538 Deficiency of other specified B group vitamins: Secondary | ICD-10-CM

## 2019-12-27 DIAGNOSIS — Z1212 Encounter for screening for malignant neoplasm of rectum: Secondary | ICD-10-CM

## 2019-12-27 MED ORDER — NA SULFATE-K SULFATE-MG SULF 17.5-3.13-1.6 GM/177ML PO SOLN: 1.0000 | Freq: Once | ORAL | 0 refills | Status: AC

## 2019-12-27 NOTE — Patient Instructions (Addendum)
If you are age 63 or older, your body mass index should be between 23-30. Your Body mass index is 25.89 kg/m. If this is out of the aforementioned range listed, please consider follow up with your Primary Care Provider.  If you are age 25 or younger, your body mass index should be between 19-25. Your Body mass index is 25.89 kg/m. If this is out of the aformentioned range listed, please consider follow up with your Primary Care Provider.   We have sent the following medications to your pharmacy for you to pick up at your convenience:  suprep  You have been scheduled for an endoscopy and colonoscopy. Please follow the written instructions given to you at your visit today. Please pick up your prep supplies at the pharmacy within the next 1-3 days. If you use inhalers (even only as needed), please bring them with you on the day of your procedure.  Due to recent changes in healthcare laws, you may see the results of your imaging and laboratory studies on MyChart before your provider has had a chance to review them.  We understand that in some cases there may be results that are confusing or concerning to you. Not all laboratory results come back in the same time frame and the provider may be waiting for multiple results in order to interpret others.  Please give [place] 48 hours in order for your provider to thoroughly review all the results before contacting the office for clarification of your results.

## 2019-12-27 NOTE — Progress Notes (Signed)
Chief Complaint: Iron deficiency anemia, B12 deficiency  Referring Provider:     Ronnald Nian, DO   HPI:    Donald King is a 64 y.o. male with a history of hypothyroidism, CKD, diabetes, hypertension, nephrolithiasis, chronic back pain, referred to the Gastroenterology Clinic for evaluation of iron deficiency anemia and B12 deficiency.  He is otherwise without any GI symptoms. No overt GI blood loss. No n/v/d/c/f/c, abdominal pain.   IDA noted during admission in 06/2019 for Nephrolithiasis.  Of note, he has been vegan since 1979, but consumes iron containing vegetables.  No MVI, supplements.  Review previous labs notable for the following: -Baseline Hgb 12.5-13.7 from 2000 08/01/2015 -08/24/2015: H/H 12.7/38.4, MCV/RDW 88.8/14 -07/12/2019: H/H 9.3/20.7, MCV/RDW 94.6/12.8 -07/13/2019: H/H 8.1/24.9, MCV/RDW 91.9/12.7 -07/14/2019: H/H 7/21.5, MCV/RDW 90.7/2.4; ferritin 43, iron 11, TIBC 210, sat 5%, B12 141 -06/2019: Was prescribed oral iron supplement, which he took until he ran out (unsure how long).  Treated with B12 injection x1 -08/06/2019: H/H 8.1/26, MCV 92.9 -12/13/2019: H/H 10.1/30, MCV 93.9; ferritin 20, iron 103, TIBC 308, sat 33%; creatinine 2.68, otherwise normal CMP  No previous EGD or colonoscopy.  Did have hematuria with his nephrolithiasis in 06/2019, but felt not significant enough to cause this degree of IDA.  Brother with liver CA and presumed cirrhosis.  Otherwise, no family history of GI malignancy, IBD.   CBC Latest Ref Rng & Units 12/13/2019 08/06/2019 07/15/2019  WBC 4.0 - 10.5 K/uL 3.9(L) 5.9 2.2(L)  Hemoglobin 13.0 - 17.0 g/dL 10.1(L) 8.1(L) 7.5(L)  Hematocrit 39.0 - 52.0 % 30.2(L) 26.0(L) 22.6(L)  Platelets 150.0 - 400.0 K/uL 151.0 290 134(L)      Past Medical History:  Diagnosis Date  . Diabetes mellitus without complication ([place])   . Hypertension   . Hypothyroidism      Past Surgical History:  Procedure Laterality Date  . APPENDECTOMY   1966  . CYSTOSCOPY W/ URETERAL STENT PLACEMENT Bilateral 07/12/2019   Procedure: CYSTOSCOPY WITH RETROGRADE AND URETERAL STENT PLACEMENT;  Surgeon: Alexis Frock, MD;  Location: [place];  Service: Urology;  Laterality: Bilateral;  . CYSTOSCOPY WITH RETROGRADE PYELOGRAM, URETEROSCOPY AND STENT PLACEMENT Bilateral 08/09/2019   Procedure: CYSTOSCOPY WITH RETROGRADE PYELOGRAM, URETEROSCOPY AND STENT PLACEMENT;  Surgeon: Alexis Frock, MD;  [facility];  Service: Urology;  Laterality: Bilateral;  75 MINS  . HOLMIUM LASER APPLICATION Bilateral [id number]   Procedure: HOLMIUM LASER APPLICATION;  Surgeon: Alexis Frock, MD;  [facility];  Service: Urology;  Laterality: Bilateral;  . URETEROSCOPY Left 07/12/2019   Procedure: LEFT DIAGNOSTIC URETEROSCOPY;  Surgeon: Alexis Frock, MD;  Location: [place];  Service: Urology;  Laterality: Left;   Family History  Problem Relation Age of Onset  . Stroke Mother   . Heart disease Mother   . Kidney disease Mother   . Hypertension Mother   . Diabetes Mother   . Cancer Mother   . Alcohol abuse Father    Social History   Tobacco Use  . Smoking status: Never Smoker  . Smokeless tobacco: Never Used  Substance Use Topics  . Alcohol use: No  . Drug use: No    Comment: cocaine 20 years ago (since 2016)   Current Outpatient Medications  Medication Sig Dispense Refill  . glipiZIDE (GLUCOTROL) 5 MG tablet 1 tab po BID x 2 wks then increase to 2 tabs po BID 360 tablet 1  . levothyroxine (SYNTHROID) 125 MCG tablet Take  1 tablet (125 mcg total) by mouth daily. 90 tablet 1  . lisinopril (ZESTRIL) 10 MG tablet Take 10 mg by mouth daily.    . meloxicam (MOBIC) 15 MG tablet Take 15 mg by mouth daily.    . metFORMIN (GLUCOPHAGE) 1000 MG tablet Take 1,000 mg by mouth daily with breakfast.    . senna-docusate (SENOKOT-S) 8.6-50 MG tablet Take 1 tablet by mouth 2 (two) times daily. While taking strong pain meds to prevent constipation. (Patient not taking:  Reported on 12/20/2019) 10 tablet 0  . sildenafil (VIAGRA) 100 MG tablet Take 100 mg by mouth daily as needed.     No current facility-administered medications for this visit.   No Known Allergies   Review of Systems: All systems reviewed and negative except where noted in HPI.     Physical Exam:    Wt Readings from Last 3 Encounters:  12/20/19 150 lb (68 kg)  08/09/19 141 lb (64 kg)  08/06/19 141 lb (64 kg)    There were no vitals taken for this visit. Constitutional:  Pleasant, in no acute distress. Psychiatric: Normal mood and affect. Behavior is normal. EENT: Pupils normal.  Conjunctivae are normal. No scleral icterus. Neck supple. No cervical LAD. Cardiovascular: Normal rate, regular rhythm. No edema Pulmonary/chest: Effort normal and breath sounds normal. No wheezing, rales or rhonchi. Abdominal: Soft, nondistended, nontender. Bowel sounds active throughout. There are no masses palpable. No hepatomegaly. Neurological: Alert and oriented to person place and time. Skin: Skin is warm and dry. No rashes noted.   ASSESSMENT AND PLAN;   1) Iron Deficiency Anemia 2) B12 deficiency 64 year old male with IDA noted during admission in 06/2019 for nephrolithiasis.  Treated with oral iron supplementation with improvement in H/H and iron indices on labs earlier this month.  Discussed DDx for IDA.  Suspect decreased oral iron intake related to vegan diet, however, given age, degree of anemia, strong patient preference for further evaluation, certainly evaluate for additional GI pathology as outlined below.  -EGD with duodenal biopsies -Colonoscopy -Plan to restart oral iron therapy after EGD/colonoscopy pending endoscopic findings, with repeat CBC and iron panel 3 months later.  May need ongoing supplemental iron such as PNV -Check B12, folate, vitamin D (checking for co-malabsorption) with next set of labs  3) CRC screening: -No previous CRC screening -Colonoscopy for work-up of  IDA along with routine, age-appropriate, average risk CRC screening  The indications, risks, and benefits of EGD and colonoscopy were explained to the patient in detail. Risks include but are not limited to bleeding, perforation, adverse reaction to medications, and cardiopulmonary compromise. Sequelae include but are not limited to the possibility of surgery, hositalization, and mortality. The patient verbalized understanding and wished to proceed. All questions answered, referred to scheduler and bowel prep ordered. Further recommendations pending results of the exam.     Lavena Bullion, DO, FACG  12/27/2019, 1:00 PM   Ayasha Ellingsen, Garvin Fila, DO

## 2020-01-01 ENCOUNTER — Encounter: Payer: 59 | Admitting: Gastroenterology

## 2020-01-09 ENCOUNTER — Telehealth: Payer: Self-pay | Admitting: *Deleted

## 2020-01-09 ENCOUNTER — Telehealth: Payer: Self-pay

## 2020-01-09 NOTE — Telephone Encounter (Signed)
Attempted to call for new patient assessment. Message left.

## 2020-01-09 NOTE — Progress Notes (Signed)
Patient: Donald King  Service Category: E/M  Provider: Gaspar Cola, MD  DOB: [birth year]  DOS: 01/13/2020  Location: Office  MRN: [id number]  Setting: Ambulatory outpatient  Referring Provider: Ronnald Nian, DO  Type: New Patient  Specialty: Interventional Pain Management  PCP: Ronnald Nian, DO  Location: Remote location  Delivery: TeleHealth     Virtual Encounter - Pain Management PROVIDER NOTE: Information contained herein reflects review and annotations entered in association with encounter. Interpretation of such information and data should be left to medically-trained personnel. Information provided to patient can be located elsewhere in the medical record under "Patient Instructions". Document created using STT-dictation technology, any transcriptional errors that may result from process are unintentional.    Contact & Pharmacy Preferred: [phone] Home: [phone] (home) Mobile: [phone] (mobile) E-mail: [email or web address]  [place], [place] - [address], [address], [place] [address] Phone: [phone] Fax: [phone]  [facility] [phone] - [place], [address] [place] [address] Phone: [phone] Fax: [phone]  [place], [place] - [address] [facility]. [address] [place] [address] Phone: [phone] Fax: [phone]  [id number] [place] [address]: [phone]: [phone]  Pre-screening note:  Our staff contacted Donald King offered him an "in person", "face-to-face" appointment versus a telephone encounter. He indicated preferring the telephone encounter, at this time.  Primary Reason(s) for Visit: Tele-Encounter for initial evaluation of one or more chronic  problems (new to examiner) potentially causing chronic pain, and posing a threat to normal musculoskeletal function. (Level of risk: High) CC: No chief complaint on file.  I contacted Donald King 01/13/2020 via telephone.      I clearly identified myself as FGaspar Cola MD. I verified that I was speaking with the correct person using two identifiers (Name: Donald King and date of birth: [id number].  Advanced Informed Consent I sought verbal advanced consent from Donald King. I informed [name] possible security and privacy concerns, risks, and limitations associated with providing "not-in-person" medical evaluation and management services. I also informed Donald King the availability of "in-person" appointments. Finally, I informed him that there would be a charge for the virtual visit and that he could be  personally, fully or partially, financially responsible for it. Donald King and agreed to proceed.   HPI  Donald King a 64y.o. year old, male patient, contacted today for an initial evaluation of his chronic pain. [name] DM (diabetes mellitus), type 2 ([place]; Hypothyroid; Arthritis; Erectile dysfunction; HLD (hyperlipidemia); Hypertension; Nephrolithiasis; Acute on chronic renal failure ([place]; Acute renal failure ([place]; Chronic pain syndrome; Pharmacologic therapy; Disorder of skeletal system; Problems influencing health status; Chronic low back pain (Bilateral) (L>R) w/o sciatica; DDD (degenerative disc disease), lumbosacral; CKD (chronic kidney disease) stage 4, GFR 15-29 ml/min ([place]; Vitamin B12 deficiency; Vitamin B12 deficiency (dietary) anemia; History of 2019 novel coronavirus disease (COVID-19); Lumbar facet syndrome (Bilateral) (L>R); Peripheral neuropathy; Neurogenic pain; Chronic musculoskeletal pain; Toe pain, bilateral; Chronic hand pain (Bilateral); Chronic knee pain (Bilateral) (L>R); Joint pain in fingers of  both hands; and Osteoarthritis involving multiple joints on their problem list.   Onset and Duration: Gradual and Date of onset: more than 9 years Cause of pain: Unknown Severity: Getting  worse, NAS-11 at its worse: 10/10, NAS-11 at its best: 6/10, NAS-11 now: 9/10 and NAS-11 on the average: 6/10 Timing: Not influenced by the time of the day Aggravating Factors: Bending, Lifiting, Prolonged sitting, Prolonged standing and Walking Alleviating Factors: Medications Associated Problems: Depression, Fatigue, Swelling, Tingling and Pain that wakes patient up Quality of Pain: Aching, Constant, Stabbing and Tingling Previous Examinations or Tests: The patient denies ,patient is unsure Previous Treatments: Narcotic medications  64 y/o male referred to [place] in consultation due to chronic back pain, previously on oxycodone 70m TID but not in the recent past due to incarceration.  According to PMP the last prescription was filled on 08/10/2019.  According to the patient his primary area of pain is that of the lower back, bilaterally, with the left being worse than the right.  He denies any prior surgeries, nerve blocks, or any kind of x-rays in the past 2 years.  He also denies any physical therapy.  His low back pain seems to radiate towards the lower extremities, bilaterally, with the left being worse than the right.  In the case of the left lower extremity the pain goes down to the knee through the back of the leg.  In the case of the right lower extremity it also goes down to about the level of the knee, also through the back of the leg.  The patient King positive pain on hyperextension and rotation referred towards the lower portion of the back.  In the case of rotation towards the right side the pain seems to go towards the right while rotation to the left seems to go towards the left of midline.  The patient's secondary area pain is that of the toes, bilaterally, with the left side being worse than the  right.  He describes having non-insulin-dependent diabetes mellitus and the pain is described to be a burning sensation that goes to all of the toes, but primarily the big toe and the top of the foot and what seems to be an L5 dermatomal distribution.  He does indicate having a little bit of weakness on toe walking, which seems to be more guarding than anything else since he describes that it hurts too much to do that.  [name] no problems with heel walking.  The patient's third area pain is that of the knees, bilaterally, with the left being worse than the right.  He describes not having had any type of surgeries, injections, or any x-rays in the past 2 years.  The next area of pain is that of the fingers, bilaterally, with the left being worse than the right.  He describes being right-handed.  The pain primarily goes to the thumb and index finger although sometimes will cover all of them.  He indicates having taken meloxicam and Tylenol while he was in prison, but he refers that they do not help.  He was quick to point out that before going to prison he used to take oxycodone 10 mg which did not work for his pain.  Historic Controlled Substance Pharmacotherapy Review  Current opioid analgesics:  None Highest recorded MME/day: 56.25 mg/day MME/day: 0 mg/day   Historical Monitoring: The patient  reports no history of drug use. List of all UDS Test(s): Lab Results  Component Value Date   COCAINSCRNUR NONE DETECTED 04/11/2015   COCAINSCRNUR NEG 10/02/2013   PCPSCRNUR NEG 10/02/2013   THCU NONE DETECTED 04/11/2015   ETH <5 04/11/2015   List of other Serum/Urine Drug Screening Test(s):  Lab Results  Component Value Date   COCAINSCRNUR NONE DETECTED 04/11/2015   COCAINSCRNUR NEG 10/02/2013   THCU NONE DETECTED 04/11/2015   ETH <5 04/11/2015   Historical Background Evaluation: [place] PMP: PDMP reviewed during this encounter. Two (2) year initial data search conducted.             PMP NARX Score  Report:  Narcotic: 140 Sedative: 070 Stimulant: 000 Risk Assessment Profile: PMP NARX Overdose Risk Score: 290  Pharmacologic Plan: As per protocol, I have not taken over any controlled substance management, pending the results of ordered tests and/or consults.            Initial impression: Pending review of available data and ordered tests.  Meds   Current Outpatient Medications:  .  glipiZIDE (GLUCOTROL) 5 MG tablet, 1 tab po BID x 2 wks then increase to 2 tabs po BID, Disp: 360 tablet, Rfl: 1 .  levothyroxine (SYNTHROID) 125 MCG tablet, Take 1 tablet (125 mcg total) by mouth daily., Disp: 90 tablet, Rfl: 1 .  sildenafil (VIAGRA) 100 MG tablet, Take 100 mg by mouth daily as needed., Disp: , Rfl:   ROS  Cardiovascular: High blood pressure Pulmonary or Respiratory: Snoring  Neurological: No reported neurological signs or symptoms such as seizures, abnormal skin sensations, urinary and/or fecal incontinence, being born with an abnormal open spine and/or a tethered spinal cord Psychological-Psychiatric: Depressed Gastrointestinal: No reported gastrointestinal signs or symptoms such as vomiting or evacuating blood, reflux, heartburn, alternating episodes of diarrhea and constipation, inflamed or scarred liver, or pancreas or irrregular and/or infrequent bowel movements Genitourinary: No reported renal or genitourinary signs or symptoms such as difficulty voiding or producing urine, peeing blood, non-functioning kidney, kidney stones, difficulty emptying the bladder, difficulty controlling the flow of urine, or chronic kidney disease Hematological: No reported hematological signs or symptoms such as prolonged bleeding, low or poor functioning platelets, bruising or bleeding easily, hereditary bleeding problems, low energy levels due to low hemoglobin or being anemic Endocrine: High blood sugar controlled without the use of insulin (NIDDM) Rheumatologic: Rheumatoid arthritis Musculoskeletal:  Negative for myasthenia gravis, muscular dystrophy, multiple sclerosis or malignant hyperthermia Work History: Disabled  Allergies  Donald King No Known Allergies.  Laboratory Chemistry Profile   Renal Lab Results  Component Value Date   BUN 14 12/13/2019   CREATININE 2.68 (H) 12/13/2019   GFR 29.23 (L) 12/13/2019   GFRAA 28 (L) 08/09/2019   GFRNONAA 24 (L) 08/09/2019   PROTEINUR 30 (A) 07/14/2019    Electrolytes Lab Results  Component Value Date   NA 140 12/13/2019   K 4.4 12/13/2019   CL 108 12/13/2019   CALCIUM 9.0 12/13/2019   PHOS 3.8 10/02/2013    Hepatic Lab Results  Component Value Date   AST 16 12/13/2019   ALT 10 12/13/2019   ALBUMIN 3.9 12/13/2019   ALKPHOS 77 12/13/2019   LIPASE 51 07/12/2019    ID Lab Results  Component Value Date   HIV Non Reactive 07/12/2019   [place] Not Detected 11/27/2019    Bone Lab Results  Component Value Date   TESTOSTERONE 350.59 01/21/2009    Endocrine Lab Results  Component Value Date   GLUCOSE 185 (H) 12/13/2019   GLUCOSEU NEGATIVE 07/14/2019   HGBA1C 7.2 (H) 12/13/2019   TSH 9.05 (H) 12/13/2019   FREET4 0.74 12/13/2019   TESTOSTERONE 350.59 01/21/2009    Neuropathy Lab Results  Component Value Date   VITAMINB12 141 (L) 07/14/2019   HGBA1C 7.2 (  H) 12/13/2019   HIV Non Reactive 07/12/2019    CNS No results found.  Inflammation (CRP: Acute  ESR: Chronic) No results found.  Rheumatology No results found.  Coagulation Lab Results  Component Value Date   PLT 151.0 12/13/2019    Cardiovascular Lab Results  Component Value Date   HGB 10.1 (L) 12/13/2019   HCT 30.2 (L) 12/13/2019    Screening Lab Results  Component Value Date   SARSCOV2NAA Not Detected 11/27/2019   HIV Non Reactive 07/12/2019    Cancer No results found for: CEA, CA125, LABCA2  Note: Lab results reviewed.  Imaging Review  Lumbosacral Imaging: Lumbar DG (Complete) 4+V:  Results for orders placed during the hospital  encounter of 02/05/13  DG Lumbar Spine Complete   Narrative *RADIOLOGY REPORT*  Clinical Data: History of chronic back pain and low back pain.  No known injury.  LUMBAR SPINE - COMPLETE 4+ VIEW  Comparison: None.  Findings: There are five non-rib bearing lumbar-type vertebral bodies.  There is abnormal narrowing of the intervertebral disc space at the level of L4-L5.  There is marginal osteophyte formation representing degenerative spondylosis.  No fracture or bony destruction is evident.  No pars defect is seen.  There is apophyseal degenerative spondylosis at L5-S1 level.  SI joints appear intact.  There is fecal distention of portions of the colon.  IMPRESSION: Changes of degenerative disc disease and degenerative spondylosis are present.  Fecal distention of portions of the colon.   Original Report Authenticated By: Shanon Brow Call          Complexity Note: Imaging results reviewed. Results shared with Donald King, using Layman's terms.                        PFSH  Drug: Donald King  reports no history of drug use. Alcohol:  reports no history of alcohol use. Tobacco:  reports that [name] never smoked. [name] never used smokeless tobacco. Medical:  King a past medical history of Diabetes mellitus without complication ([place]), Hypertension, and Hypothyroidism. Family: family history includes Alcohol abuse in his father; Cancer in his mother; Diabetes in his mother; Heart disease in his mother; Hypertension in his mother; Kidney disease in his mother; Liver cancer in his brother; Stroke in his mother.  Past Surgical History:  Procedure Laterality Date  . APPENDECTOMY  1966  . CYSTOSCOPY W/ URETERAL STENT PLACEMENT Bilateral 07/12/2019   Procedure: CYSTOSCOPY WITH RETROGRADE AND URETERAL STENT PLACEMENT;  Surgeon: Alexis Frock, MD;  Location: [facility];  Service: Urology;  Laterality: Bilateral;  . CYSTOSCOPY WITH RETROGRADE PYELOGRAM, URETEROSCOPY AND STENT PLACEMENT Bilateral  08/09/2019   Procedure: CYSTOSCOPY WITH RETROGRADE PYELOGRAM, URETEROSCOPY AND STENT PLACEMENT;  Surgeon: Alexis Frock, MD;  Location: [facility];  Service: Urology;  Laterality: Bilateral;  75 MINS  . HOLMIUM LASER APPLICATION Bilateral 6/43/3295   Procedure: HOLMIUM LASER APPLICATION;  Surgeon: Alexis Frock, MD;  Location: [facility];  Service: Urology;  Laterality: Bilateral;  . URETEROSCOPY Left 07/12/2019   Procedure: LEFT DIAGNOSTIC URETEROSCOPY;  Surgeon: Alexis Frock, MD;  Location: [facility];  Service: Urology;  Laterality: Left;   Active Ambulatory Problems    Diagnosis Date Noted  . DM (diabetes mellitus), type 2 ([place]) 12/27/2012  . Hypothyroid 12/27/2012  . Arthritis 12/27/2012  . Erectile dysfunction 01/22/2013  . HLD (hyperlipidemia) 03/22/2013  . Hypertension 06/25/2019  . Nephrolithiasis 07/12/2019  . Acute on chronic renal failure ([place]) 07/12/2019  . Acute renal failure ([place])  07/12/2019  . Chronic pain syndrome 01/13/2020  . Pharmacologic therapy 01/13/2020  . Disorder of skeletal system 01/13/2020  . Problems influencing health status 01/13/2020  . Chronic low back pain (Bilateral) (L>R) w/o sciatica 01/13/2020  . DDD (degenerative disc disease), lumbosacral 01/13/2020  . CKD (chronic kidney disease) stage 4, GFR 15-29 ml/min ([facility]) 01/13/2020  . Vitamin B12 deficiency 01/13/2020  . Vitamin B12 deficiency (dietary) anemia 01/13/2020  . History of 2019 novel coronavirus disease (COVID-19) 01/13/2020  . Lumbar facet syndrome (Bilateral) (L>R) 01/13/2020  . Peripheral neuropathy 01/13/2020  . Neurogenic pain 01/13/2020  . Chronic musculoskeletal pain 01/13/2020  . Toe pain, bilateral 01/13/2020  . Chronic hand pain (Bilateral) 01/13/2020  . Chronic knee pain (Bilateral) (L>R) 01/13/2020  . Joint pain in fingers of both hands 01/13/2020  . Osteoarthritis involving multiple joints 01/13/2020   Resolved Ambulatory Problems    Diagnosis Date Noted  . Insomnia 12/27/2012   . Hyperkalemia 07/12/2019  . COVID-19 07/12/2019   Past Medical History:  Diagnosis Date  . Diabetes mellitus without complication ([place])   . Hypothyroidism    Assessment  Primary Diagnosis & Pertinent Problem List: The primary encounter diagnosis was Chronic low back pain (Bilateral) (L>R) w/o sciatica. Diagnoses of Lumbar facet syndrome (Bilateral) (L>R), DDD (degenerative disc disease), lumbosacral, Chronic knee pain (Bilateral) (L>R), Toe pain, bilateral, Chronic hand pain, unspecified laterality, Joint pain in fingers of both hands, Osteoarthritis involving multiple joints, Chronic pain syndrome, Neurogenic pain, Other polyneuropathy, Chronic musculoskeletal pain, Pharmacologic therapy, Disorder of skeletal system, Problems influencing health status, and Vitamin B12 deficiency were also pertinent to this visit.  Visit Diagnosis (New problems to examiner): 1. Chronic low back pain (Bilateral) (L>R) w/o sciatica   2. Lumbar facet syndrome (Bilateral) (L>R)   3. DDD (degenerative disc disease), lumbosacral   4. Chronic knee pain (Bilateral) (L>R)   5. Toe pain, bilateral   6. Chronic hand pain, unspecified laterality   7. Joint pain in fingers of both hands   8. Osteoarthritis involving multiple joints   9. Chronic pain syndrome   10. Neurogenic pain   11. Other polyneuropathy   12. Chronic musculoskeletal pain   13. Pharmacologic therapy   14. Disorder of skeletal system   15. Problems influencing health status   16. Vitamin B12 deficiency    Plan of Care (Initial workup plan)  Note: Donald King was reminded that as per protocol, today's visit King been an evaluation only. We have not taken over the patient's controlled substance management.  Problem-specific plan: No problem-specific Assessment & Plan notes found for this encounter.   Lab Orders     Compliance Drug Analysis, Ur     Comp. Metabolic Panel (12)     Magnesium     Vitamin B12     Sedimentation rate      25-Hydroxy vitamin D Lcms D2+D3     C-reactive protein  Imaging Orders     DG Knee 1-2 Views Right     DG Knee 1-2 Views Left     DG Foot Complete Left     DG Foot Complete Right     DG Lumbar Spine Complete W/Bend     DG Hand Complete Left     DG Hand Complete Right Referral Orders  No referral(s) requested today   Procedure Orders    No procedure(s) ordered today   Pharmacotherapy (current): Medications ordered:  No orders of the defined types were placed in this encounter.    Pharmacological management  options:  Opioid Analgesics: The patient was informed that there is no guarantee that he would be a candidate for opioid analgesics. The decision will be made following CDC guidelines. This decision will be based on the results of diagnostic studies, as well as Donald King risk profile.   Membrane stabilizer: To be determined at a later time  Muscle relaxant: To be determined at a later time  NSAID: To be determined at a later time  Other analgesic(s): To be determined at a later time   Interventional management options: Donald King was informed that there is no guarantee that he would be a candidate for interventional therapies. The decision will be based on the results of diagnostic studies, as well as Donald King risk profile.  Procedure(s) under consideration:  Diagnostic bilateral lumbar facet block  Posterolateral lumbar facet RFA  Diagnostic bilateral L5 transforaminal ESI    Provider-requested follow-up: Return for (VV), (s/p Tests).  No future appointments. Total duration of encounter: 35 minutes.  Primary Care Physician: Ronnald Nian, DO Note by: Gaspar Cola, MD Date: 01/13/2020; Time: 2:47 PM

## 2020-01-09 NOTE — Telephone Encounter (Signed)
Left message for patient to call the office to go over new patient questionaire prior to appointment.

## 2020-01-10 ENCOUNTER — Telehealth: Payer: Self-pay

## 2020-01-10 NOTE — Telephone Encounter (Signed)
LM for patient to call office

## 2020-01-13 ENCOUNTER — Ambulatory Visit: Payer: 59 | Attending: Pain Medicine | Admitting: Pain Medicine

## 2020-01-13 ENCOUNTER — Encounter: Payer: Self-pay | Admitting: Pain Medicine

## 2020-01-13 ENCOUNTER — Other Ambulatory Visit: Payer: Self-pay

## 2020-01-13 VITALS — Ht 66.0 in | Wt 155.0 lb

## 2020-01-13 DIAGNOSIS — G8929 Other chronic pain: Secondary | ICD-10-CM | POA: Insufficient documentation

## 2020-01-13 DIAGNOSIS — D518 Other vitamin B12 deficiency anemias: Secondary | ICD-10-CM | POA: Insufficient documentation

## 2020-01-13 DIAGNOSIS — M25541 Pain in joints of right hand: Secondary | ICD-10-CM | POA: Insufficient documentation

## 2020-01-13 DIAGNOSIS — M79643 Pain in unspecified hand: Secondary | ICD-10-CM

## 2020-01-13 DIAGNOSIS — E538 Deficiency of other specified B group vitamins: Secondary | ICD-10-CM | POA: Insufficient documentation

## 2020-01-13 DIAGNOSIS — M79674 Pain in right toe(s): Secondary | ICD-10-CM | POA: Insufficient documentation

## 2020-01-13 DIAGNOSIS — N184 Chronic kidney disease, stage 4 (severe): Secondary | ICD-10-CM | POA: Insufficient documentation

## 2020-01-13 DIAGNOSIS — M5137 Other intervertebral disc degeneration, lumbosacral region: Secondary | ICD-10-CM

## 2020-01-13 DIAGNOSIS — G6289 Other specified polyneuropathies: Secondary | ICD-10-CM

## 2020-01-13 DIAGNOSIS — M25561 Pain in right knee: Secondary | ICD-10-CM | POA: Diagnosis not present

## 2020-01-13 DIAGNOSIS — M545 Low back pain, unspecified: Secondary | ICD-10-CM

## 2020-01-13 DIAGNOSIS — Z789 Other specified health status: Secondary | ICD-10-CM

## 2020-01-13 DIAGNOSIS — M792 Neuralgia and neuritis, unspecified: Secondary | ICD-10-CM

## 2020-01-13 DIAGNOSIS — G894 Chronic pain syndrome: Secondary | ICD-10-CM

## 2020-01-13 DIAGNOSIS — Z79899 Other long term (current) drug therapy: Secondary | ICD-10-CM | POA: Insufficient documentation

## 2020-01-13 DIAGNOSIS — M25562 Pain in left knee: Secondary | ICD-10-CM

## 2020-01-13 DIAGNOSIS — M899 Disorder of bone, unspecified: Secondary | ICD-10-CM | POA: Insufficient documentation

## 2020-01-13 DIAGNOSIS — M25542 Pain in joints of left hand: Secondary | ICD-10-CM

## 2020-01-13 DIAGNOSIS — M79675 Pain in left toe(s): Secondary | ICD-10-CM

## 2020-01-13 DIAGNOSIS — M47816 Spondylosis without myelopathy or radiculopathy, lumbar region: Secondary | ICD-10-CM | POA: Diagnosis not present

## 2020-01-13 DIAGNOSIS — M159 Polyosteoarthritis, unspecified: Secondary | ICD-10-CM

## 2020-01-13 DIAGNOSIS — G629 Polyneuropathy, unspecified: Secondary | ICD-10-CM | POA: Insufficient documentation

## 2020-01-13 DIAGNOSIS — M8949 Other hypertrophic osteoarthropathy, multiple sites: Secondary | ICD-10-CM

## 2020-01-13 DIAGNOSIS — M7918 Myalgia, other site: Secondary | ICD-10-CM

## 2020-01-13 DIAGNOSIS — Z8616 Personal history of COVID-19: Secondary | ICD-10-CM | POA: Insufficient documentation

## 2020-02-12 LAB — BASIC METABOLIC PANEL
BUN: 14 (ref 4–21)
Chloride: 114 — AB (ref 99–108)
Creatinine: 2.8 — AB (ref 0.6–1.3)
Glucose: 152
Potassium: 5.1 (ref 3.4–5.3)
Sodium: 144 (ref 137–147)

## 2020-02-12 LAB — COMPREHENSIVE METABOLIC PANEL
Albumin: 4 (ref 3.5–5.0)
Calcium: 8.5 — AB (ref 8.7–10.7)
GFR calc Af Amer: 27
GFR calc non Af Amer: 23

## 2020-02-14 ENCOUNTER — Other Ambulatory Visit: Payer: Self-pay | Admitting: Nephrology

## 2020-02-14 ENCOUNTER — Other Ambulatory Visit: Payer: Self-pay | Admitting: Internal Medicine

## 2020-02-14 DIAGNOSIS — N184 Chronic kidney disease, stage 4 (severe): Secondary | ICD-10-CM

## 2020-02-17 ENCOUNTER — Other Ambulatory Visit: Payer: Self-pay | Admitting: Nephrology

## 2020-02-17 DIAGNOSIS — N184 Chronic kidney disease, stage 4 (severe): Secondary | ICD-10-CM

## 2020-02-20 ENCOUNTER — Encounter: Payer: Self-pay | Admitting: Internal Medicine

## 2020-02-20 ENCOUNTER — Ambulatory Visit
Admission: RE | Admit: 2020-02-20 | Discharge: 2020-02-20 | Disposition: A | Discharge status: Home / Self Care | Payer: 59 | Source: Ambulatory Visit | Attending: Internal Medicine | Admitting: Internal Medicine

## 2020-02-20 ENCOUNTER — Encounter: Payer: Self-pay | Admitting: Pain Medicine

## 2020-02-20 DIAGNOSIS — N184 Chronic kidney disease, stage 4 (severe): Secondary | ICD-10-CM

## 2020-03-04 ENCOUNTER — Encounter: Payer: Self-pay | Admitting: Family Medicine

## 2020-03-04 NOTE — Telephone Encounter (Signed)
Please see message and advise.  Thank you. ° °

## 2020-07-25 IMAGING — US US RENAL
1 series · 14 of 25 positions shown · non-contrast
Comparison: CT 07/12/2019.

CLINICAL DATA: Chronic renal disease.

EXAM:
RENAL / URINARY TRACT ULTRASOUND COMPLETE

[Series 1: us renal · 0.23mm/px · 53 acquisitions, 14 frames shown]
[im 1/53]
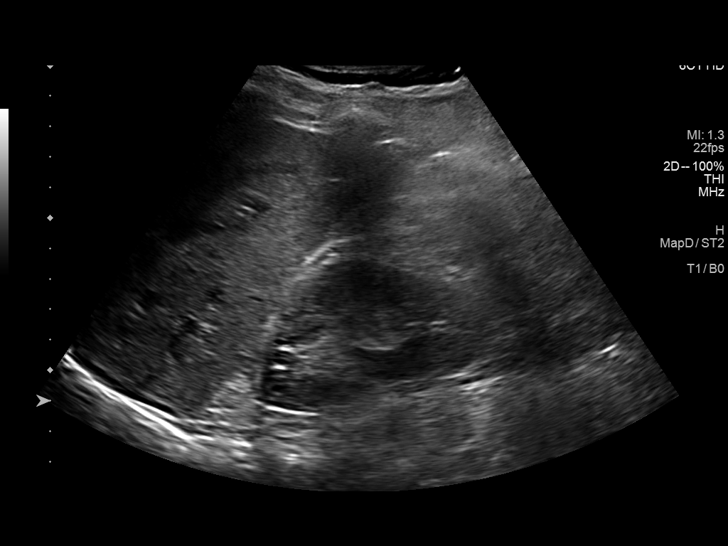
[im 5/53]
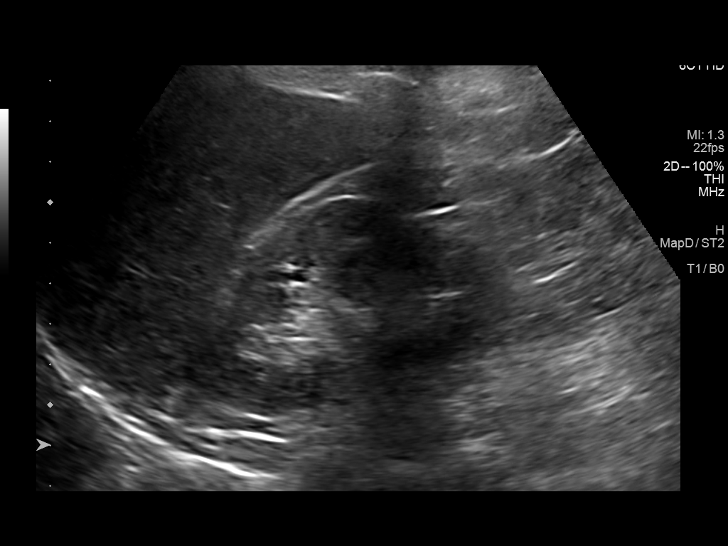
[im 9/53]
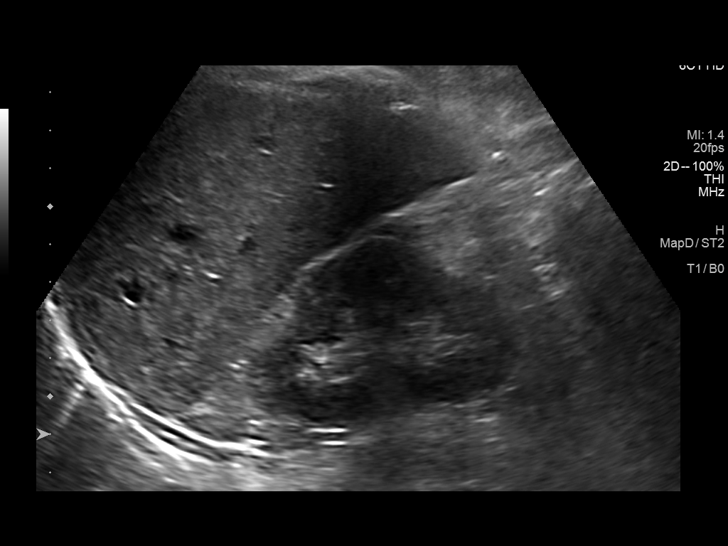
[im 14/53]
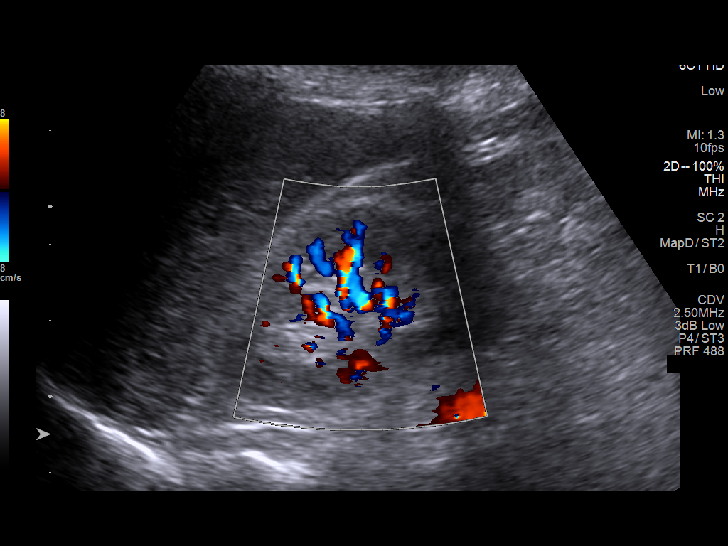
[im 18/53]
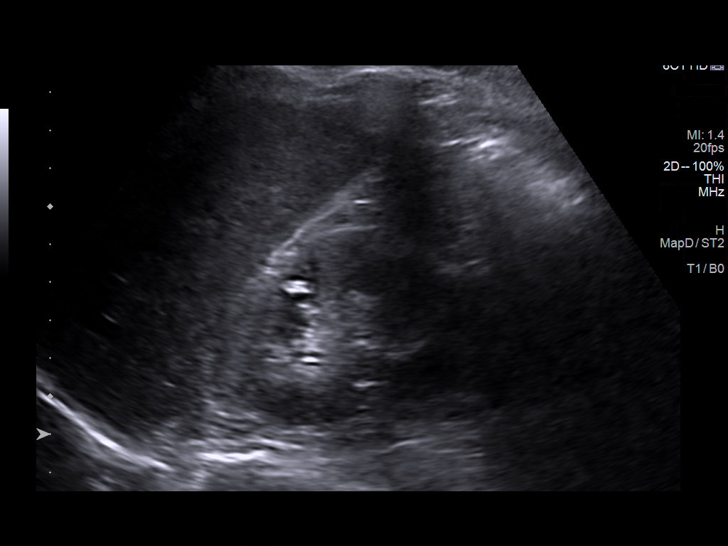
[im 20/53]
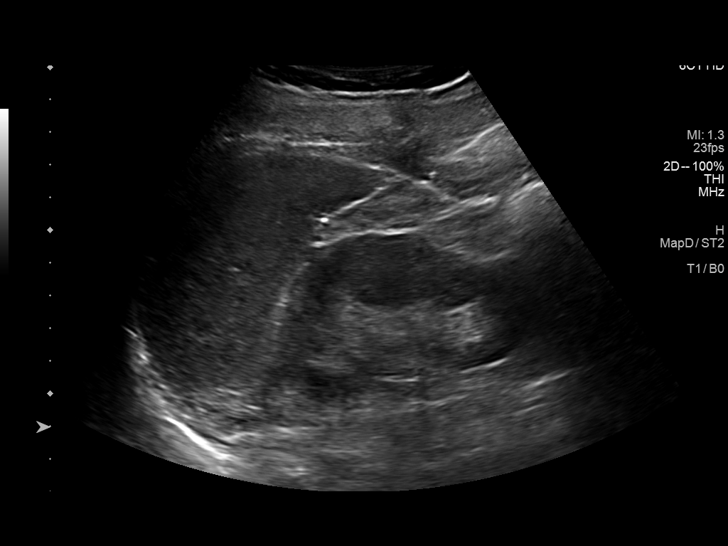
[im 24/53]
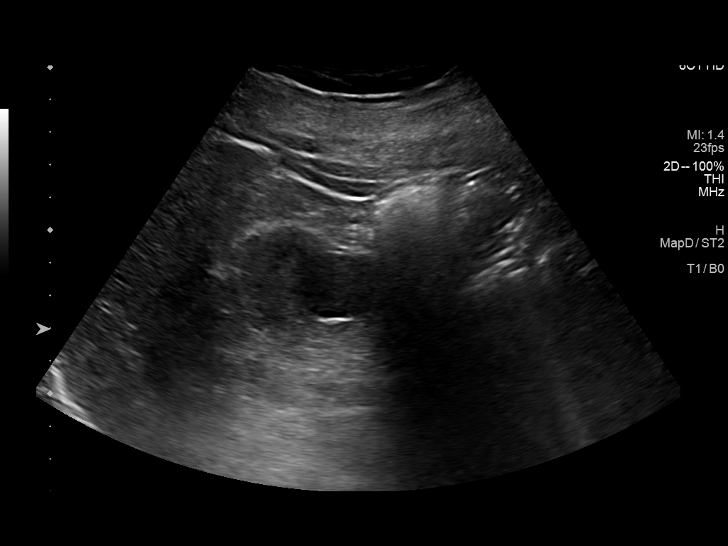
[im 29/53]
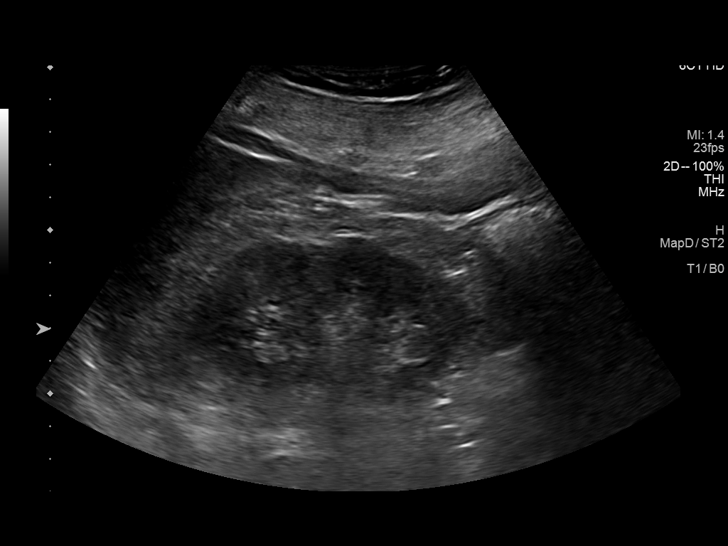
[im 33/53]
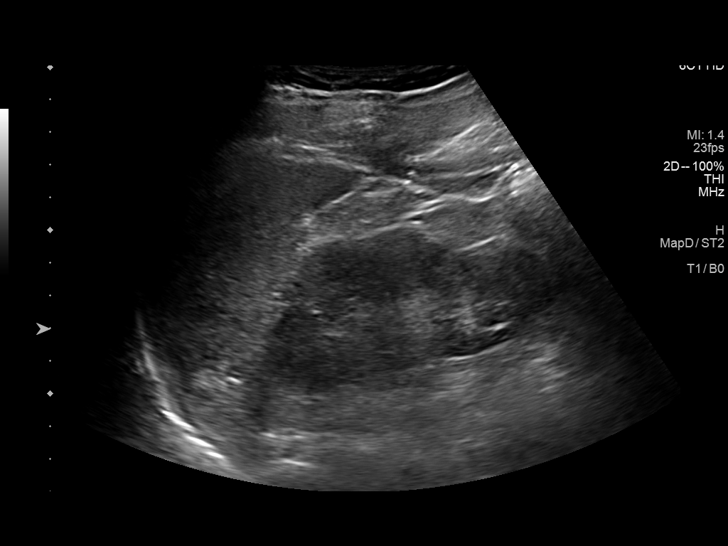
[im 35/53]
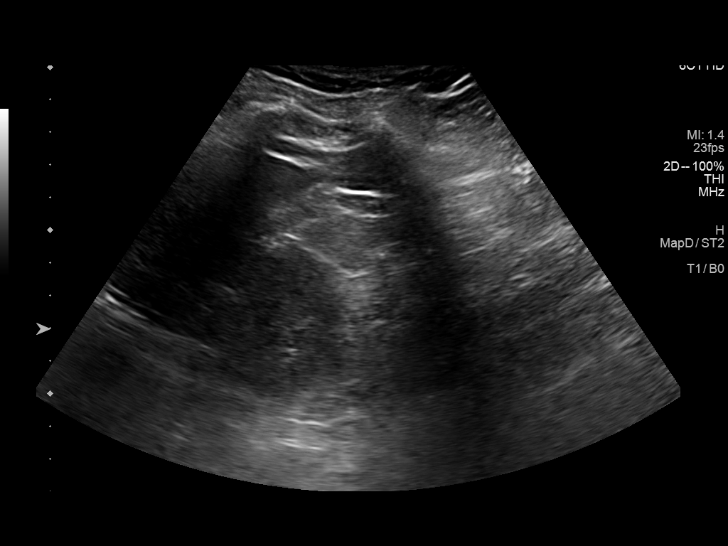
[im 40/53]
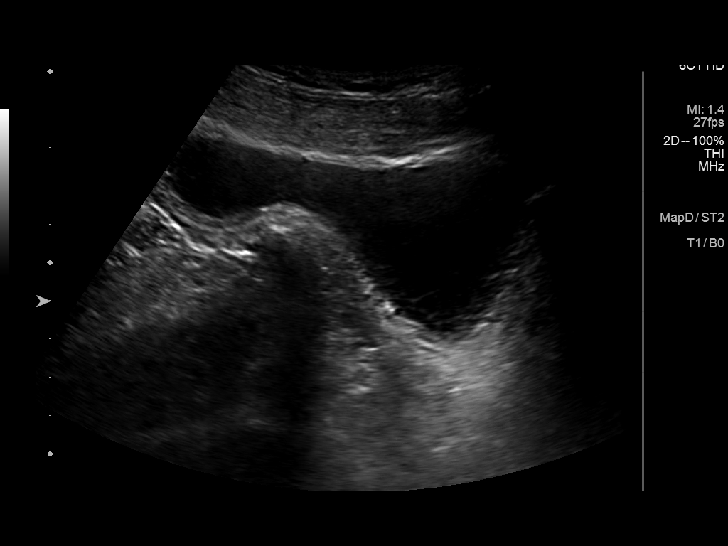
[im 44/53]
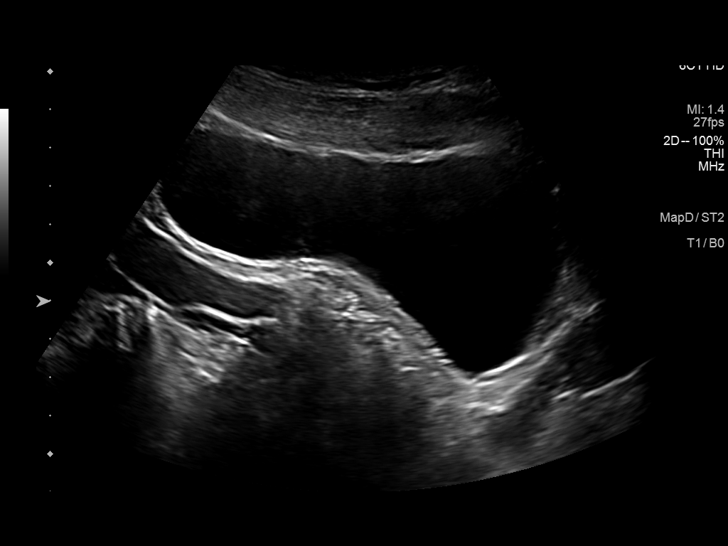
[im 48/53]
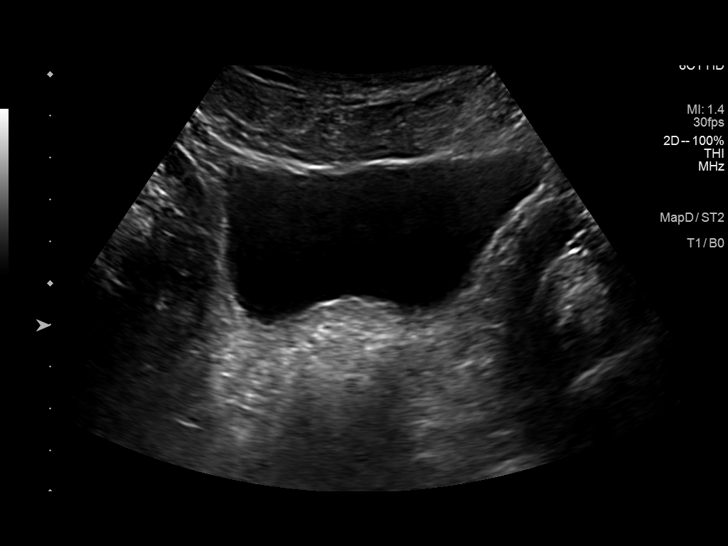
[im 53/53]
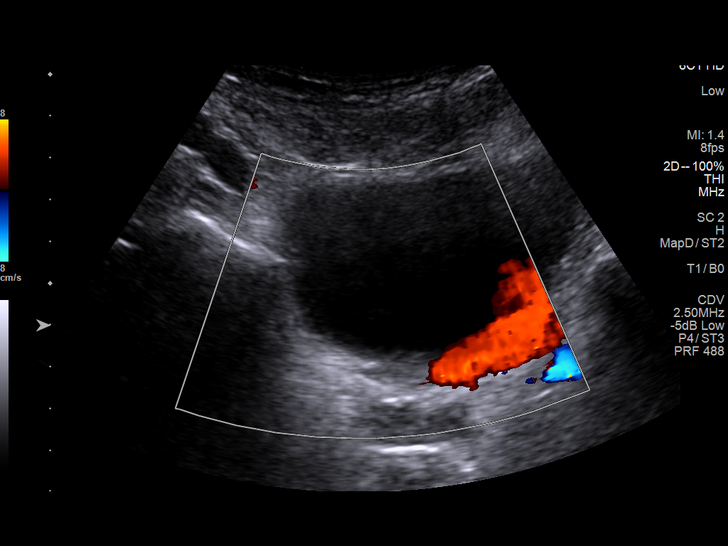

[14 of 25 positions shown; findings below may reference images not displayed]

FINDINGS: Right Kidney:

Renal measurements: 8.4 x 4.8 x 4.0 cm = volume: 84.8 mL .
Echogenicity within normal limits. No mass or hydronephrosis
visualized. Tiny echogenic focus suggesting right renal stone.
Similar finding noted on prior CT.

Left Kidney:

Renal measurements: 9.0 x 4.7 x 4.2 cm = volume: 9 tube mL.
Echogenicity within normal limits. 2.6 cm stable cyst with possible
thin septation most likely benign. No hydronephrosis visualized.

Bladder:

Appears normal for degree of bladder distention.

Other:

None.
IMPRESSION: No acute renal abnormality identified. Stable nonobstructing right
renal stone. Stable benign-appearing cyst left kidney. No
hydronephrosis or bladder distention.

## 2023-12-15 ENCOUNTER — Other Ambulatory Visit: Payer: Self-pay | Admitting: Internal Medicine

## 2023-12-15 DIAGNOSIS — N184 Chronic kidney disease, stage 4 (severe): Secondary | ICD-10-CM

## 2023-12-20 ENCOUNTER — Other Ambulatory Visit: Payer: Medicaid Other

## 2023-12-21 ENCOUNTER — Ambulatory Visit
Admission: RE | Admit: 2023-12-21 | Discharge: 2023-12-21 | Disposition: A | Discharge status: Home / Self Care | Payer: Non-veteran care | Source: Ambulatory Visit | Attending: Internal Medicine | Admitting: Internal Medicine

## 2023-12-21 DIAGNOSIS — N184 Chronic kidney disease, stage 4 (severe): Secondary | ICD-10-CM

## 2024-01-03 ENCOUNTER — Ambulatory Visit (INDEPENDENT_AMBULATORY_CARE_PROVIDER_SITE_OTHER): Payer: Self-pay | Admitting: Orthopaedic Surgery

## 2024-01-03 DIAGNOSIS — M5441 Lumbago with sciatica, right side: Secondary | ICD-10-CM

## 2024-01-03 DIAGNOSIS — G8929 Other chronic pain: Secondary | ICD-10-CM

## 2024-01-03 DIAGNOSIS — M5442 Lumbago with sciatica, left side: Secondary | ICD-10-CM

## 2024-01-03 NOTE — Progress Notes (Signed)
 The patient was referred to orthopedics by his primary care physician due to chronic bilateral low back pain with radicular symptoms going down into both legs.  He has been dealing with this for over a decade.  He is 68 years old.  He has had physical therapy as well as injections in his back.  He is on chronic narcotics as well.  At this point he has had a MRI of his lumbar spine just 2 weeks ago showing severe multifactorial stenosis mainly at L4-L5.  He denies any change in bowel or bladder function but definitely needs at this point a referral to a spine specialist.  We will see if we can get him an appointment with my partner Dr. Georgina.  I did let the patient know that as well and this is a no charge visit from my standpoint today.  I have encouraged him to actually get a copy of the disc with his MRI studies on them.  We were able to look at the report from that MRI but he does need the disc itself.

## 2024-01-24 ENCOUNTER — Ambulatory Visit: Payer: Self-pay | Admitting: Orthopedic Surgery

## 2024-02-14 DIAGNOSIS — M5459 Other low back pain: Secondary | ICD-10-CM | POA: Diagnosis not present

## 2024-02-15 DIAGNOSIS — I70219 Atherosclerosis of native arteries of extremities with intermittent claudication, unspecified extremity: Secondary | ICD-10-CM | POA: Diagnosis not present

## 2024-02-15 DIAGNOSIS — Z0000 Encounter for general adult medical examination without abnormal findings: Secondary | ICD-10-CM | POA: Diagnosis not present

## 2024-02-15 DIAGNOSIS — D485 Neoplasm of uncertain behavior of skin: Secondary | ICD-10-CM | POA: Diagnosis not present

## 2024-02-15 DIAGNOSIS — E785 Hyperlipidemia, unspecified: Secondary | ICD-10-CM | POA: Diagnosis not present

## 2024-02-15 DIAGNOSIS — H9113 Presbycusis, bilateral: Secondary | ICD-10-CM | POA: Diagnosis not present

## 2024-02-15 DIAGNOSIS — Z1389 Encounter for screening for other disorder: Secondary | ICD-10-CM | POA: Diagnosis not present

## 2024-02-15 DIAGNOSIS — R1032 Left lower quadrant pain: Secondary | ICD-10-CM | POA: Diagnosis not present

## 2024-02-15 DIAGNOSIS — R002 Palpitations: Secondary | ICD-10-CM | POA: Diagnosis not present

## 2024-02-15 DIAGNOSIS — R42 Dizziness and giddiness: Secondary | ICD-10-CM | POA: Diagnosis not present

## 2024-02-15 DIAGNOSIS — Z1331 Encounter for screening for depression: Secondary | ICD-10-CM | POA: Diagnosis not present

## 2024-02-26 DIAGNOSIS — M5459 Other low back pain: Secondary | ICD-10-CM | POA: Diagnosis not present

## 2024-03-02 DIAGNOSIS — R42 Dizziness and giddiness: Secondary | ICD-10-CM | POA: Diagnosis not present

## 2024-03-08 DIAGNOSIS — R42 Dizziness and giddiness: Secondary | ICD-10-CM | POA: Diagnosis not present

## 2024-03-13 DIAGNOSIS — F411 Generalized anxiety disorder: Secondary | ICD-10-CM | POA: Diagnosis not present

## 2024-03-13 DIAGNOSIS — M545 Low back pain, unspecified: Secondary | ICD-10-CM | POA: Diagnosis not present

## 2024-03-13 DIAGNOSIS — E663 Overweight: Secondary | ICD-10-CM | POA: Diagnosis not present

## 2024-03-13 DIAGNOSIS — Z6825 Body mass index (BMI) 25.0-25.9, adult: Secondary | ICD-10-CM | POA: Diagnosis not present

## 2024-03-13 DIAGNOSIS — E039 Hypothyroidism, unspecified: Secondary | ICD-10-CM | POA: Diagnosis not present

## 2024-03-13 DIAGNOSIS — E785 Hyperlipidemia, unspecified: Secondary | ICD-10-CM | POA: Diagnosis not present

## 2024-03-13 DIAGNOSIS — E1122 Type 2 diabetes mellitus with diabetic chronic kidney disease: Secondary | ICD-10-CM | POA: Diagnosis not present

## 2024-03-13 DIAGNOSIS — N184 Chronic kidney disease, stage 4 (severe): Secondary | ICD-10-CM | POA: Diagnosis not present

## 2024-03-13 DIAGNOSIS — Z008 Encounter for other general examination: Secondary | ICD-10-CM | POA: Diagnosis not present

## 2024-03-13 DIAGNOSIS — Z794 Long term (current) use of insulin: Secondary | ICD-10-CM | POA: Diagnosis not present

## 2024-03-13 DIAGNOSIS — E1169 Type 2 diabetes mellitus with other specified complication: Secondary | ICD-10-CM | POA: Diagnosis not present

## 2024-03-15 DIAGNOSIS — H5203 Hypermetropia, bilateral: Secondary | ICD-10-CM | POA: Diagnosis not present

## 2024-03-18 DIAGNOSIS — M5459 Other low back pain: Secondary | ICD-10-CM | POA: Diagnosis not present

## 2024-03-19 DIAGNOSIS — D485 Neoplasm of uncertain behavior of skin: Secondary | ICD-10-CM | POA: Diagnosis not present

## 2024-03-26 ENCOUNTER — Encounter: Payer: Self-pay | Admitting: Physician Assistant

## 2024-04-01 DIAGNOSIS — M5459 Other low back pain: Secondary | ICD-10-CM | POA: Diagnosis not present

## 2024-04-14 DIAGNOSIS — E1165 Type 2 diabetes mellitus with hyperglycemia: Secondary | ICD-10-CM | POA: Diagnosis not present

## 2024-04-15 DIAGNOSIS — D485 Neoplasm of uncertain behavior of skin: Secondary | ICD-10-CM | POA: Diagnosis not present

## 2024-04-30 DIAGNOSIS — Z87442 Personal history of urinary calculi: Secondary | ICD-10-CM | POA: Diagnosis not present

## 2024-04-30 DIAGNOSIS — Z008 Encounter for other general examination: Secondary | ICD-10-CM | POA: Diagnosis not present

## 2024-04-30 DIAGNOSIS — E785 Hyperlipidemia, unspecified: Secondary | ICD-10-CM | POA: Diagnosis not present

## 2024-04-30 DIAGNOSIS — N184 Chronic kidney disease, stage 4 (severe): Secondary | ICD-10-CM | POA: Diagnosis not present

## 2024-04-30 DIAGNOSIS — E1122 Type 2 diabetes mellitus with diabetic chronic kidney disease: Secondary | ICD-10-CM | POA: Diagnosis not present

## 2024-05-12 DIAGNOSIS — M5459 Other low back pain: Secondary | ICD-10-CM | POA: Diagnosis not present

## 2024-05-13 DIAGNOSIS — D485 Neoplasm of uncertain behavior of skin: Secondary | ICD-10-CM | POA: Diagnosis not present

## 2024-05-21 ENCOUNTER — Ambulatory Visit: Payer: Self-pay | Admitting: Physician Assistant

## 2024-05-21 ENCOUNTER — Encounter: Payer: Self-pay | Admitting: Physician Assistant

## 2024-05-21 ENCOUNTER — Other Ambulatory Visit (INDEPENDENT_AMBULATORY_CARE_PROVIDER_SITE_OTHER)

## 2024-05-21 ENCOUNTER — Ambulatory Visit: Admitting: Physician Assistant

## 2024-05-21 VITALS — BP 98/78 | HR 80 | Ht 63.75 in | Wt 156.2 lb

## 2024-05-21 DIAGNOSIS — E1122 Type 2 diabetes mellitus with diabetic chronic kidney disease: Secondary | ICD-10-CM

## 2024-05-21 DIAGNOSIS — N184 Chronic kidney disease, stage 4 (severe): Secondary | ICD-10-CM

## 2024-05-21 DIAGNOSIS — D509 Iron deficiency anemia, unspecified: Secondary | ICD-10-CM

## 2024-05-21 DIAGNOSIS — Z794 Long term (current) use of insulin: Secondary | ICD-10-CM | POA: Diagnosis not present

## 2024-05-21 DIAGNOSIS — Z862 Personal history of diseases of the blood and blood-forming organs and certain disorders involving the immune mechanism: Secondary | ICD-10-CM

## 2024-05-21 DIAGNOSIS — E538 Deficiency of other specified B group vitamins: Secondary | ICD-10-CM | POA: Diagnosis not present

## 2024-05-21 DIAGNOSIS — K5900 Constipation, unspecified: Secondary | ICD-10-CM | POA: Diagnosis not present

## 2024-05-21 DIAGNOSIS — K5904 Chronic idiopathic constipation: Secondary | ICD-10-CM

## 2024-05-21 LAB — COMPREHENSIVE METABOLIC PANEL WITH GFR
ALT: 19 U/L (ref 0–53)
AST: 25 U/L (ref 0–37)
Albumin: 3.9 g/dL (ref 3.5–5.2)
Alkaline Phosphatase: 60 U/L (ref 39–117)
BUN: 12 mg/dL (ref 6–23)
CO2: 26 meq/L (ref 19–32)
Calcium: 9.3 mg/dL (ref 8.4–10.5)
Chloride: 107 meq/L (ref 96–112)
Creatinine, Ser: 3.39 mg/dL — ABNORMAL HIGH (ref 0.40–1.50)
GFR: 17.95 mL/min — ABNORMAL LOW (ref >60.00)
Glucose, Bld: 83 mg/dL (ref 70–99)
Potassium: 4.1 meq/L (ref 3.5–5.1)
Sodium: 139 meq/L (ref 135–145)
Total Bilirubin: 1.2 mg/dL (ref 0.2–1.2)
Total Protein: 7.9 g/dL (ref 6.0–8.3)

## 2024-05-21 LAB — CBC WITH DIFFERENTIAL/PLATELET
Basophils Absolute: 0 10*3/uL (ref 0.0–0.1)
Basophils Relative: 0.7 % (ref 0.0–3.0)
Eosinophils Absolute: 0.3 10*3/uL (ref 0.0–0.7)
Eosinophils Relative: 5.2 % — ABNORMAL HIGH (ref 0.0–5.0)
HCT: 38.1 % — ABNORMAL LOW (ref 39.0–52.0)
Hemoglobin: 12.8 g/dL — ABNORMAL LOW (ref 13.0–17.0)
Lymphocytes Relative: 27.3 % (ref 12.0–46.0)
Lymphs Abs: 1.6 10*3/uL (ref 0.7–4.0)
MCHC: 33.5 g/dL (ref 30.0–36.0)
MCV: 93.2 fl (ref 78.0–100.0)
Monocytes Absolute: 0.4 10*3/uL (ref 0.1–1.0)
Monocytes Relative: 7 % (ref 3.0–12.0)
Neutro Abs: 3.6 10*3/uL (ref 1.4–7.7)
Neutrophils Relative %: 59.8 % (ref 43.0–77.0)
Platelets: 208 10*3/uL (ref 150.0–400.0)
RBC: 4.09 Mil/uL — ABNORMAL LOW (ref 4.22–5.81)
RDW: 13.4 % (ref 11.5–15.5)
WBC: 6 10*3/uL (ref 4.0–10.5)

## 2024-05-21 LAB — IBC + FERRITIN
Ferritin: 26.4 ng/mL (ref 22.0–322.0)
Iron: 173 ug/dL — ABNORMAL HIGH (ref 42–165)
Saturation Ratios: 57.2 % — ABNORMAL HIGH (ref 20.0–50.0)
TIBC: 302.4 ug/dL (ref 250.0–450.0)
Transferrin: 216 mg/dL (ref 212.0–360.0)

## 2024-05-21 LAB — VITAMIN B12: Vitamin B-12: 199 pg/mL — ABNORMAL LOW (ref 211–911)

## 2024-05-21 MED ORDER — NA SULFATE-K SULFATE-MG SULF 17.5-3.13-1.6 GM/177ML PO SOLN: ORAL | 0 refills | Status: AC

## 2024-05-21 MED ORDER — POLYETHYLENE GLYCOL 3350 17 G PO PACK: 17.0000 g | PACK | Freq: Two times a day (BID) | ORAL | 3 refills | Status: AC

## 2024-05-21 NOTE — Patient Instructions (Addendum)
 Your provider has requested that you go to the basement level for lab work before leaving today. Press B on the elevator. The lab is located at the first door on the left as you exit the elevator.  Miralax  is an osmotic laxative.  It only brings more water into the stool.  This is safe to take daily.  Can take up to 17 gram of miralax  twice a day.  Mix with juice or coffee.  Start 1 capful for 3-4 days and reassess your response in 3-4 days.  You can increase and decrease the dose based on your response.  Remember, it can take up to 3-4 days to take effect OR for the effects to wear off.   I often pair this with benefiber in the morning to help assure the stool is not too loose.   Toileting tips to help with your constipation - Drink at least 64-80 ounces of water/liquid per day. - Establish a time to try to move your bowels every day.  For many people, this is after a cup of coffee or after a meal such as breakfast. - Sit all of the way back on the toilet keeping your back fairly straight and while sitting up, try to rest the tops of your forearms on your upper thighs.   - Raising your feet with a step stool/squatty potty can be helpful to improve the angle that allows your stool to pass through the rectum. - Relax the rectum feeling it bulge toward the toilet water.  If you feel your rectum raising toward your body, you are contracting rather than relaxing. - Breathe in and slowly exhale. Belly breath by expanding your belly towards your belly button. Keep belly expanded as you gently direct pressure down and back to the anus.  A low pitched GRRR sound can assist with increasing intra-abdominal pressure.  (Can also trying to blow on a pinwheel and make it move, this helps with the same belly breathing) - Repeat 3-4 times. If unsuccessful, contract the pelvic floor to restore normal tone and get off the toilet.  Avoid excessive straining. - To reduce excessive wiping by teaching your anus  to normally contract, place hands on outer aspect of knees and resist knee movement outward.  Hold 5-10 second then place hands just inside of knees and resist inward movement of knees.  Hold 5 seconds.  Repeat a few times each way.  Go to the ER if unable to pass gas, severe AB pain, unable to hold down food, any shortness of breath of chest pain. You have been scheduled for an endoscopy and colonoscopy. Please follow the written instructions given to you at your visit today.  If you use inhalers (even only as needed), please bring them with you on the day of your procedure.  DO NOT TAKE 7 DAYS PRIOR TO TEST- Trulicity (dulaglutide) Ozempic, Wegovy (semaglutide) Mounjaro (tirzepatide) Bydureon Bcise (exanatide extended release)  DO NOT TAKE 1 DAY PRIOR TO YOUR TEST Rybelsus (semaglutide) Adlyxin (lixisenatide) Victoza (liraglutide) Byetta (exanatide) ___________________________________________________________________________  We have sent the following medications to your pharmacy for you to pick up at your convenience: Suprep  _______________________________________________________  If your blood pressure at your visit was 140/90 or greater, please contact your primary care physician to follow up on this.  _______________________________________________________  If you are age 68 or older, your body mass index should be between 23-30. Your Body mass index is 27.03 kg/m. If this is out of the aforementioned range listed, please  consider follow up with your Primary Care Provider.  If you are age 57 or younger, your body mass index should be between 19-25. Your Body mass index is 27.03 kg/m. If this is out of the aformentioned range listed, please consider follow up with your Primary Care Provider.   ________________________________________________________  The [facility] providers would like to encourage you to use MYCHART to communicate with providers for non-urgent requests or  questions.  Due to long hold times on the telephone, sending your provider a message by [facility] may be a faster and more efficient way to get a response.  Please allow 48 business hours for a response.  Please remember that this is for non-urgent requests.  _______________________________________________________  Due to recent changes in healthcare laws, you may see the results of your imaging and laboratory studies on MyChart before your provider has had a chance to review them.  We understand that in some cases there may be results that are confusing or concerning to you. Not all laboratory results come back in the same time frame and the provider may be waiting for multiple results in order to interpret others.  Please give us  48 hours in order for your provider to thoroughly review all the results before contacting the office for clarification of your results.   Thank you for entrusting me with your care and choosing [facility]  Alan Coombs PA-C

## 2024-05-21 NOTE — Progress Notes (Signed)
 05/21/2024 Donald King [id number] [birth year]  Referring provider: No ref. provider found Primary GI doctor: Dr. San  ASSESSMENT AND PLAN:  Constipation worse last 3 months started ozempic in Jan and opoids x 2-3 months Daily BM but very hard, prune juice causes vomiting Denies hematochezia, AB pain, has had weight loss but is on GLP and very active  Likely secondary to GLP1/opoid induced constipation - Increase fiber/ water intake, decrease caffeine, increase activity level. -Will add on Miralax  daily, consider linzess/amitza - will do 2 day prep at [facility] with Dr. San with possible EGD pending on iron indices, Risk of bowel prep, conscious sedation, and EGD and colonoscopy were discussed.  Risks include but are not limited to dehydration, pain, bleeding, cardiopulmonary process, bowel perforation, or other possible adverse outcomes..  Treatment plan was discussed with patient, and agreed upon.  History of IDA 2021 possibly secondary to vegan diet now with CKD stage IV Not on iron at this time, will check iron/ferritin No upper GI symptoms, no melena, no hematochezia - check iron/ferritin - schedule Egd/colon, if no IDA will cancel EGD and continue with colonoscopy  B12 deficiency On vegan diet, will check B12 today - restart B12 if low  Type 2 diabetes on insulin  started on Ozempic with [facility] January 2025  CKD Likely contributing to anemia.  Will check kidney function today   Patient Care Team: Pcp, No as PCP - General  HISTORY OF PRESENT ILLNESS: 68 y.o. male with a past medical history listed below presents for evaluation of constipation and discuss colonoscopy.   Patient was seen in the office January 2021 by Dr. San for iron deficiency.  Patient was set up for EGD colonoscopy however this was never completed.  Discussed the use of AI scribe software for clinical note transcription with the patient, who gave verbal consent to  proceed.  History of Present Illness   Donald King is a 68 year old male who presents with constipation.  He has experienced constipation for the past three months, characterized by daily bowel movements that are very hard. There is no blood in the stool, abdominal pain, or discomfort. He has not tried any treatments for constipation due to adverse reactions to prunes, which cause vomiting.  He is currently on a once-a-week injectable medication from the [place], which he started in January. He has a history of iron deficiency noted in January 2021, for which an endoscopy and colonoscopy were recommended but not completed. He denies any history of colonoscopy or endoscopy.  There is no family history of colon or stomach cancer, but his mother had breast cancer. He follows a vegan diet since 1979 and has no history of alcohol , drug, or nicotine use. He is physically active, walking for an hour to an hour and a half regularly, and reports weight loss.  He is not currently taking any B12 supplements despite a past deficiency, and he is not on any iron supplements. No dark black stool, heartburn, nausea, vomiting, or trouble swallowing.      He  reports that he has never smoked. He has never used smokeless tobacco. He reports that he does not currently use drugs. He reports that he does not drink alcohol .  Brother history of liver cancer presumed cirrhosis no other family history of GI malignancy  RELEVANT GI HISTORY, IMAGING AND LABS: Results          CBC    Component Value Date/Time   WBC 3.9 (L) 12/13/2019 9191  RBC 3.22 (L) 12/13/2019 0808   HGB 10.1 (L) 12/13/2019 0808   HCT 30.2 (L) 12/13/2019 0808   HCT 20.6 (L) 07/14/2019 0728   PLT 151.0 12/13/2019 0808   MCV 93.9 12/13/2019 0808   MCH 28.9 08/06/2019 0943   MCHC 33.3 12/13/2019 0808   RDW 14.4 12/13/2019 0808   LYMPHSABS 0.5 (L) 07/14/2019 0728   MONOABS 0.4 07/14/2019 0728   EOSABS 0.0 07/14/2019 0728   BASOSABS 0.0  07/14/2019 0728   No results for input(s): HGB in the last 8760 hours.  CMP     Component Value Date/Time   NA 144 02/12/2020 0000   K 5.1 02/12/2020 0000   CL 114 (A) 02/12/2020 0000   CO2 23 12/13/2019 0808   GLUCOSE 185 (H) 12/13/2019 0808   BUN 14 02/12/2020 0000   CREATININE 2.8 (A) 02/12/2020 0000   CREATININE 2.68 (H) 12/13/2019 0808   CALCIUM  8.5 (A) 02/12/2020 0000   PROT 7.2 12/13/2019 0808   ALBUMIN 4.0 02/12/2020 0000   AST 16 12/13/2019 0808   ALT 10 12/13/2019 0808   ALKPHOS 77 12/13/2019 0808   BILITOT 0.6 12/13/2019 0808   GFRNONAA 23 02/12/2020 0000   GFRAA 27 02/12/2020 0000      Latest Ref Rng & Units 02/12/2020   12:00 AM 12/13/2019    8:08 AM 07/14/2019    7:28 AM  Hepatic Function  Total Protein 6.0 - 8.3 g/dL  7.2  6.0   Albumin 3.5 - 5.0 4.0     3.9  2.4   AST 0 - 37 U/L  16  18   ALT 0 - 53 U/L  10  13   Alk Phosphatase 39 - 117 U/L  77  54   Total Bilirubin 0.2 - 1.2 mg/dL  0.6  0.4   Bilirubin, Direct 0.0 - 0.2 mg/dL   <9.8      This result is from an external source.      Current Medications:   Current Outpatient Medications (Endocrine & Metabolic):    glipiZIDE  (GLUCOTROL ) 10 MG tablet, Take 20 mg by mouth 2 (two) times daily before a meal.   insulin  glargine (LANTUS SOLOSTAR) 100 UNIT/ML Solostar Pen, Inject 20 Units into the skin daily.   levothyroxine  (SYNTHROID ) 125 MCG tablet, Take 1 tablet (125 mcg total) by mouth daily.   Semaglutide,0.25 or 0.5MG /DOS, 2 MG/3ML SOPN, Inject 0.5 mg into the skin once a week.  Current Outpatient Medications (Cardiovascular):    atorvastatin (LIPITOR) 20 MG tablet, Take 20 mg by mouth daily.   sildenafil  (VIAGRA ) 100 MG tablet, Take 100 mg by mouth daily as needed.   Current Outpatient Medications (Analgesics):    oxyCODONE  (ROXICODONE ) 15 MG immediate release tablet, Take 15 mg by mouth 4 (four) times daily.   Current Outpatient Medications (Other):    alprazolam  (XANAX ) 2 MG tablet, Take 2  mg by mouth as needed.   Na Sulfate-K Sulfate-Mg Sulfate concentrate (SUPREP) 17.5-3.13-1.6 GM/177ML SOLN, Use as directed; may use generic; goodrx card if insurance will not cover generic   polyethylene glycol (MIRALAX  / GLYCOLAX ) 17 g packet, Take 17 g by mouth 2 (two) times daily.   Medical History:  Past Medical History:  Diagnosis Date   Arthritis    Diabetes mellitus without complication (HCC)    HLD (hyperlipidemia)    Hypertension    Hypothyroidism    Iron deficiency    Kidney disease    Allergies: No Known Allergies   Surgical History:  He  has a past surgical history that includes Appendectomy (1966); Cystoscopy w/ ureteral stent placement (Bilateral, 07/12/2019); Ureteroscopy (Left, 07/12/2019); Cystoscopy with retrograde pyelogram, ureteroscopy and stent placement (Bilateral, 08/09/2019); and Holmium laser application (Bilateral, 08/09/2019). Family History:  His family history includes Alcohol  abuse in his father; Breast cancer in his mother; Diabetes in his mother and sister; Heart disease in his mother; Hypertension in his mother; Kidney disease in his mother; Liver cancer in his brother; Stroke in his mother.  REVIEW OF SYSTEMS  : All other systems reviewed and negative except where noted in the History of Present Illness.  PHYSICAL EXAM: BP 98/78 (BP Location: Left Arm, Patient Position: Sitting, Cuff Size: Normal)   Pulse 80   Ht 5' 3.75 (1.619 m) Comment: height measured without shoes  Wt 156 lb 4 oz (70.9 kg)   BMI 27.03 kg/m  Physical Exam   GENERAL APPEARANCE: Well nourished, in no apparent distress. HEENT: No cervical lymphadenopathy, unremarkable thyroid , sclerae anicteric, conjunctiva pink. RESPIRATORY: Respiratory effort normal, breath sounds clear to auscultation bilaterally without rales, rhonchi, or wheezing. CARDIO: RRR with no MRGs, peripheral pulses intact. ABDOMEN: Soft, non-distended, active bowel sounds in all 4 quadrants, non-tender to palpation,  no rebound, no mass appreciated. RECTAL: Declines. MUSCULOSKELETAL: Full ROM, normal gait, without edema. SKIN: Dry, intact without rashes or lesions. No jaundice. NEURO: Alert, oriented, no focal deficits. PSYCH: Cooperative, normal mood and affect.      Alan JONELLE Coombs, PA-C 10:45 AM

## 2024-06-09 DIAGNOSIS — M5459 Other low back pain: Secondary | ICD-10-CM | POA: Diagnosis not present

## 2024-06-10 DIAGNOSIS — D485 Neoplasm of uncertain behavior of skin: Secondary | ICD-10-CM | POA: Diagnosis not present

## 2024-06-11 DIAGNOSIS — Z008 Encounter for other general examination: Secondary | ICD-10-CM | POA: Diagnosis not present

## 2024-06-11 DIAGNOSIS — E1122 Type 2 diabetes mellitus with diabetic chronic kidney disease: Secondary | ICD-10-CM | POA: Diagnosis not present

## 2024-06-11 DIAGNOSIS — N184 Chronic kidney disease, stage 4 (severe): Secondary | ICD-10-CM | POA: Diagnosis not present

## 2024-06-11 DIAGNOSIS — E785 Hyperlipidemia, unspecified: Secondary | ICD-10-CM | POA: Diagnosis not present

## 2024-06-27 ENCOUNTER — Ambulatory Visit: Admitting: Gastroenterology

## 2024-06-27 ENCOUNTER — Encounter: Payer: Self-pay | Admitting: Gastroenterology

## 2024-06-27 VITALS — BP 137/81 | HR 52 | Temp 97.3°F | Resp 14 | Ht 63.0 in | Wt 156.0 lb

## 2024-06-27 DIAGNOSIS — K31A0 Gastric intestinal metaplasia, unspecified: Secondary | ICD-10-CM | POA: Diagnosis not present

## 2024-06-27 DIAGNOSIS — B9681 Helicobacter pylori [H. pylori] as the cause of diseases classified elsewhere: Secondary | ICD-10-CM

## 2024-06-27 DIAGNOSIS — D120 Benign neoplasm of cecum: Secondary | ICD-10-CM | POA: Diagnosis not present

## 2024-06-27 DIAGNOSIS — E119 Type 2 diabetes mellitus without complications: Secondary | ICD-10-CM | POA: Diagnosis not present

## 2024-06-27 DIAGNOSIS — E538 Deficiency of other specified B group vitamins: Secondary | ICD-10-CM

## 2024-06-27 DIAGNOSIS — K2289 Other specified disease of esophagus: Secondary | ICD-10-CM | POA: Diagnosis not present

## 2024-06-27 DIAGNOSIS — K2950 Unspecified chronic gastritis without bleeding: Secondary | ICD-10-CM

## 2024-06-27 DIAGNOSIS — D123 Benign neoplasm of transverse colon: Secondary | ICD-10-CM

## 2024-06-27 DIAGNOSIS — K624 Stenosis of anus and rectum: Secondary | ICD-10-CM

## 2024-06-27 DIAGNOSIS — E039 Hypothyroidism, unspecified: Secondary | ICD-10-CM | POA: Diagnosis not present

## 2024-06-27 DIAGNOSIS — D509 Iron deficiency anemia, unspecified: Secondary | ICD-10-CM

## 2024-06-27 DIAGNOSIS — B49 Unspecified mycosis: Secondary | ICD-10-CM | POA: Diagnosis not present

## 2024-06-27 DIAGNOSIS — K5909 Other constipation: Secondary | ICD-10-CM

## 2024-06-27 DIAGNOSIS — I10 Essential (primary) hypertension: Secondary | ICD-10-CM | POA: Diagnosis not present

## 2024-06-27 MED ORDER — FLUCONAZOLE 100 MG PO TABS: 100.0000 mg | ORAL_TABLET | Freq: Every day | ORAL | 0 refills | Status: AC

## 2024-06-27 MED ORDER — SODIUM CHLORIDE 0.9 % IV SOLN: 500.0000 mL | Freq: Once | INTRAVENOUS | Status: DC

## 2024-06-27 NOTE — Progress Notes (Signed)
 Sedate, gd SR, tolerated procedure well, VSS, report to RN

## 2024-06-27 NOTE — Op Note (Signed)
 [facility] Patient Name: Donald King Procedure Date: 06/27/2024 9:40 AM MRN: [id number] Endoscopist: Sandor Flatter , MD, [id number] Age: 68 Referring MD:  Date of Birth: 23-Oct-1956 Gender: Male Account #: [id number] Procedure:                Colonoscopy Indications:              Iron deficiency anemia, Change in bowel habits,                            Constipation Medicines:                Monitored Anesthesia Care Procedure:                Pre-Anesthesia Assessment:                           - Prior to the procedure, a History and Physical                            was performed, and patient medications and                            allergies were reviewed. The patient's tolerance of                            previous anesthesia was also reviewed. The risks                            and benefits of the procedure and the sedation                            options and risks were discussed with the patient.                            All questions were answered, and informed consent                            was obtained. Prior Anticoagulants: The patient has                            taken no anticoagulant or antiplatelet agents. ASA                            Grade Assessment: III - A patient with severe                            systemic disease. After reviewing the risks and                            benefits, the patient was deemed in satisfactory                            condition to undergo the procedure.  After obtaining informed consent, the colonoscope                            was passed under direct vision. Throughout the                            procedure, the patient's blood pressure, pulse, and                            oxygen saturations were monitored continuously. The                            Olympus Scope SN [id number] was introduced through the                            anus and advanced to the the terminal  ileum. The                            colonoscopy was performed without difficulty. The                            patient tolerated the procedure well. The quality                            of the bowel preparation was good. The terminal                            ileum, ileocecal valve, appendiceal orifice, and                            rectum were photographed. Scope In: 10:00:35 AM Scope Out: 10:17:53 AM Total Procedure Duration: 0 hours 17 minutes 18 seconds  Findings:                 The digital rectal exam was notable for palpable                            mild anal stricture. Able to place the colonoscope                            without any difficulty. No bleeding or mucosal                            disruption noted.                           Three sessile polyps were found in the cecum. The                            polyps were 2 to 5 mm in size. These polyps were                            removed with a cold snare. Resection and retrieval  were complete. Estimated blood loss was minimal.                           A 4 mm polyp was found in the transverse colon. The                            polyp was sessile. The polyp was removed with a                            cold snare. Resection and retrieval were complete.                            Estimated blood loss was minimal.                           The retroflexed view of the distal rectum and anal                            verge was normal and showed no anal or rectal                            abnormalities.                           The terminal ileum appeared normal. Complications:            No immediate complications. Estimated Blood Loss:     Estimated blood loss was minimal. Impression:               - Anal stricture found on digital rectal exam.                           - Three 2 to 5 mm polyps in the cecum, removed with                            a cold snare. Resected and  retrieved.                           - One 4 mm polyp in the transverse colon, removed                            with a cold snare. Resected and retrieved.                           - The distal rectum and anal verge are normal on                            retroflexion view.                           - The examined portion of the ileum was normal. Recommendation:           - Patient has a contact number available for  emergencies. The signs and symptoms of potential                            delayed complications were discussed with the                            patient. Return to normal activities tomorrow.                            Written discharge instructions were provided to the                            patient.                           - Resume previous diet.                           - Continue present medications.                           - Await pathology results.                           - Repeat colonoscopy for surveillance based on                            pathology results.                           - Resume Miralax  1 capful daily and titrate to soft                            stools without straining to have a bowel movement.                            Depending on clinical response, will consider                            adding Linzess or Motegrity. Alternatively, can                            consider referral to Colorectal Surgery for mild                            anal stricture, but will plan to treat medically to                            start.                           - Return to GI office PRN. Sandor Flatter, MD 06/27/2024 10:32:05 AM

## 2024-06-27 NOTE — Progress Notes (Signed)
 Agree with the assessment and plan as outlined by Quentin Mulling, PA-C. ? ?Keron Neenan, DO, FACG ? ?

## 2024-06-27 NOTE — Progress Notes (Signed)
 Called to room to assist during endoscopic procedure.  Patient ID and intended procedure confirmed with present staff. Received instructions for my participation in the procedure from the performing physician.

## 2024-06-27 NOTE — Patient Instructions (Addendum)
 Resume previous diet.                           - Continue present medications.                           - Await pathology results.                           - Repeat colonoscopy for surveillance based on                            pathology results.                           - Resume Miralax  1 capful daily and titrate to soft                            stools without straining to have a bowel movement.                            Depending on clinical response, will consider                            adding Linzess or Motegrity. Alternatively, can                            consider referral to Colorectal Surgery for mild                            anal stricture, but will plan to treat medically to                            start. Resume previous diet.                           - Continue present medications.                           - Await pathology results.                           - Diflucan  (fluconazole ) 200 mg x1 day then 100 mg                            PO daily for 3 weeks.     YOU HAD AN ENDOSCOPIC PROCEDURE TODAY AT THE [facility]:   Refer to the procedure report that was given to you for any specific questions about what was found during the examination.  If the procedure report does not answer your questions, please call your gastroenterologist to clarify.  If you requested that your care partner not be given the details of your procedure findings, then the procedure report has been included in a sealed envelope for you to review at your convenience later.  YOU SHOULD EXPECT: Some feelings of bloating in the abdomen. Passage of more gas than usual.  Walking  can help get rid of the air that was put into your GI tract during the procedure and reduce the bloating. If you had a lower endoscopy (such as a colonoscopy or flexible sigmoidoscopy) you may notice spotting of blood in your stool or on the toilet paper. If you underwent a bowel prep for your procedure,  you may not have a normal bowel movement for a few days.  Please Note:  You might notice some irritation and congestion in your nose or some drainage.  This is from the oxygen used during your procedure.  There is no need for concern and it should clear up in a day or so.  SYMPTOMS TO REPORT IMMEDIATELY:  Following lower endoscopy (colonoscopy or flexible sigmoidoscopy):  Excessive amounts of blood in the stool  Significant tenderness or worsening of abdominal pains  Swelling of the abdomen that is new, acute  Fever of 100F or higher  Following upper endoscopy (EGD)  Vomiting of blood or coffee ground material  New chest pain or pain under the shoulder blades  Painful or persistently difficult swallowing  New shortness of breath  Fever of 100F or higher  Black, tarry-looking stools  For urgent or emergent issues, a gastroenterologist can be reached at any hour by calling [phone]. Do not use MyChart messaging for urgent concerns.    DIET:  We do recommend a small meal at first, but then you may proceed to your regular diet.  Drink plenty of fluids but you should avoid alcoholic beverages for 24 hours.  ACTIVITY:  You should plan to take it easy for the rest of today and you should NOT DRIVE or use heavy machinery until tomorrow (because of the sedation medicines used during the test).    FOLLOW UP: Our staff will call the number listed on your records the next business day following your procedure.  We will call around 7:15- 8:00 am to check on you and address any questions or concerns that you may have regarding the information given to you following your procedure. If we do not reach you, we will leave a message.     If any biopsies were taken you will be contacted by phone or by letter within the next 1-3 weeks.  Please call us  at [phone] if you have not heard about the biopsies in 3 weeks.    SIGNATURES/CONFIDENTIALITY: You and/or your care partner have signed  paperwork which will be entered into your electronic medical record.  These signatures attest to the fact that that the information above on your After Visit Summary has been reviewed and is understood.  Full responsibility of the confidentiality of this discharge information lies with you and/or your care-partner.

## 2024-06-27 NOTE — Progress Notes (Signed)
 GASTROENTEROLOGY PROCEDURE H&P NOTE   Primary Care Physician: Pcp, No    Reason for Procedure:  Constipation, change in bowel habits, iron deficiency anemia, B12 deficiency  Plan:    EGD, colonoscopy  Patient is appropriate for endoscopic procedure(s) in the ambulatory ([facility]) setting.  The nature of the procedure, as well as the risks, benefits, and alternatives were carefully and thoroughly reviewed with the patient. Ample time for discussion and questions allowed. The patient understood, was satisfied, and agreed to proceed.     HPI: Donald King is a 68 y.o. male who presents for EGD and colonoscopy for evaluation of constipation, change in bowel habits, iron deficiency, B12 deficiency anemia.   Otherwise, no significant changes in clinical hx since last OV on 05/21/24.   Past Medical History:  Diagnosis Date   Arthritis    Diabetes mellitus without complication (HCC)    HLD (hyperlipidemia)    Hypertension    Hypothyroidism    Iron deficiency    Kidney disease     Past Surgical History:  Procedure Laterality Date   APPENDECTOMY  1966   CYSTOSCOPY W/ URETERAL STENT PLACEMENT Bilateral 07/12/2019   Procedure: CYSTOSCOPY WITH RETROGRADE AND URETERAL STENT PLACEMENT;  Surgeon: Alvaro Hummer, MD;  Location: [facility] OR;  Service: Urology;  Laterality: Bilateral;   CYSTOSCOPY WITH RETROGRADE PYELOGRAM, URETEROSCOPY AND STENT PLACEMENT Bilateral 08/09/2019   Procedure: CYSTOSCOPY WITH RETROGRADE PYELOGRAM, URETEROSCOPY AND STENT PLACEMENT;  Surgeon: Alvaro Hummer, MD;  Location: [facility] ORS;  Service: Urology;  Laterality: Bilateral;  75 MINS   HOLMIUM LASER APPLICATION Bilateral 08/09/2019   Procedure: HOLMIUM LASER APPLICATION;  Surgeon: Alvaro Hummer, MD;  Location: [facility] ORS;  Service: Urology;  Laterality: Bilateral;   URETEROSCOPY Left 07/12/2019   Procedure: LEFT DIAGNOSTIC URETEROSCOPY;  Surgeon: Alvaro Hummer, MD;  Location: [facility] OR;  Service: Urology;  Laterality: Left;     Prior to Admission medications   Medication Sig Start Date End Date Taking? Authorizing Provider  alprazolam  (XANAX ) 2 MG tablet Take 2 mg by mouth as needed.   Yes [provider]  atorvastatin (LIPITOR) 20 MG tablet Take 20 mg by mouth daily. 03/16/20  Yes [provider]  glipiZIDE  (GLUCOTROL ) 10 MG tablet Take 20 mg by mouth 2 (two) times daily before a meal. 02/20/20  Yes [provider]  insulin  glargine (LANTUS SOLOSTAR) 100 UNIT/ML Solostar Pen Inject 20 Units into the skin daily. 12/10/20  Yes [provider]  levothyroxine  (SYNTHROID ) 125 MCG tablet Take 1 tablet (125 mcg total) by mouth daily. 12/20/19  Yes [place] Pacella, Mary K, DO  levothyroxine  (SYNTHROID ) 75 MCG tablet Take 75 mcg by mouth daily before breakfast.   Yes [provider]  lisinopril  (ZESTRIL ) 5 MG tablet Take 5 mg by mouth at bedtime. 06/11/24  Yes [provider]  Na Sulfate-K Sulfate-Mg Sulfate concentrate (SUPREP) 17.5-3.13-1.6 GM/177ML SOLN Use as directed; may use generic; goodrx card if insurance will not cover generic 05/21/24  Yes Craig Alan SAUNDERS, PA-C  oxyCODONE  (ROXICODONE ) 15 MG immediate release tablet Take 15 mg by mouth 4 (four) times daily.   Yes [provider]  polyethylene glycol (MIRALAX  / GLYCOLAX ) 17 g packet Take 17 g by mouth 2 (two) times daily. 05/21/24  Yes Craig Alan R, PA-C  sildenafil  (VIAGRA ) 100 MG tablet Take 100 mg by mouth daily as needed. 11/28/19  Yes [provider]  Semaglutide,0.25 or 0.5MG /DOS, 2 MG/3ML SOPN Inject 0.5 mg into the skin once a week. 12/26/23   [provider]    Current Outpatient Medications  Medication Sig Dispense Refill   alprazolam  (XANAX ) 2 MG tablet Take 2 mg by mouth as needed.     atorvastatin (LIPITOR) 20 MG tablet Take 20 mg by mouth daily.     glipiZIDE  (GLUCOTROL ) 10 MG tablet Take 20 mg by mouth 2 (two) times daily before a meal.     insulin  glargine (LANTUS  SOLOSTAR) 100 UNIT/ML Solostar Pen Inject 20 Units into the skin daily.     levothyroxine  (SYNTHROID ) 125 MCG tablet Take 1 tablet (125 mcg total) by mouth daily. 90 tablet 1   levothyroxine  (SYNTHROID ) 75 MCG tablet Take 75 mcg by mouth daily before breakfast.     lisinopril  (ZESTRIL ) 5 MG tablet Take 5 mg by mouth at bedtime.     Na Sulfate-K Sulfate-Mg Sulfate concentrate (SUPREP) 17.5-3.13-1.6 GM/177ML SOLN Use as directed; may use generic; goodrx card if insurance will not cover generic 354 mL 0   oxyCODONE  (ROXICODONE ) 15 MG immediate release tablet Take 15 mg by mouth 4 (four) times daily.     polyethylene glycol (MIRALAX  / GLYCOLAX ) 17 g packet Take 17 g by mouth 2 (two) times daily. 60 packet 3   sildenafil  (VIAGRA ) 100 MG tablet Take 100 mg by mouth daily as needed.     Semaglutide,0.25 or 0.5MG /DOS, 2 MG/3ML SOPN Inject 0.5 mg into the skin once a week.     Current Facility-Administered Medications  Medication Dose Route Frequency Provider Last Rate Last Admin   0.9 %  sodium chloride  infusion  500 mL Intravenous Once Erric Machnik V, DO        Allergies as of 06/27/2024   (No Known Allergies)    Family History  Problem Relation Age of Onset   Stroke Mother    Heart disease Mother    Kidney disease Mother    Hypertension Mother    Diabetes Mother    Breast cancer Mother    Alcohol  abuse Father    Diabetes Sister    Liver cancer Brother     Social History   Socioeconomic History   Marital status: Single    Spouse name: Not on file   Number of children: 0   Years of education: 12   Highest education level: Not on file  Occupational History    Employer: [employer]  Tobacco Use   Smoking status: Never   Smokeless tobacco: Never  Vaping Use   Vaping status: Never Used  Substance and Sexual Activity   Alcohol  use: No   Drug use: Not Currently    Comment: cocaine 20 years ago (since 2016)   Sexual activity: Yes  Other Topics Concern   Not on file   Social History Narrative   Regular exercise-no   Caffeine Use-yes         Social Drivers of Health   Financial Resource Strain: Not on file  Food Insecurity: No Food Insecurity (12/10/2020)   Received from [facility]   Hunger Vital Sign    Within the past 12 months, you worried that your food would run out before you got the money to buy more.: Never true    Within the past 12 months, the food you bought just didn't last and you didn't have money to get more.: Never true  Transportation Needs: Not on file  Physical Activity: Not on file  Stress: Not on file  Social Connections: Unknown (04/12/2022)   Received from [facility]   Social Network  Social Network: Not on file  Intimate Partner Violence: Unknown (03/04/2022)   Received from [facility]   HITS    Physically Hurt: Not on file    Insult or Talk Down To: Not on file    Threaten Physical Harm: Not on file    Scream or Curse: Not on file    Physical Exam: Vital signs in last 24 hours: @BP  (!) 143/82   Pulse 63   Temp (!) 97.3 F (36.3 C)   Ht 5' 3 (1.6 m)   Wt 156 lb (70.8 kg)   SpO2 100%   BMI 27.63 kg/m  GEN: NAD EYE: Sclerae anicteric ENT: MMM CV: Non-tachycardic Pulm: CTA b/l GI: Soft, NT/ND NEURO:  Alert & Oriented x 3   Sandor Flatter, DO [facility]   06/27/2024 9:26 AM

## 2024-06-27 NOTE — Op Note (Signed)
 [facility] Patient Name: Donald King Procedure Date: 06/27/2024 9:41 AM MRN: [id number] Endoscopist: Sandor Flatter , MD, [id number] Age: 68 Referring MD:  Date of Birth: 1956/05/20 Gender: Male Account #: [id number] Procedure:                Upper GI endoscopy Indications:              Iron deficiency anemia, B12 deficiency Medicines:                Monitored Anesthesia Care Procedure:                Pre-Anesthesia Assessment:                           - Prior to the procedure, a History and Physical                            was performed, and patient medications and                            allergies were reviewed. The patient's tolerance of                            previous anesthesia was also reviewed. The risks                            and benefits of the procedure and the sedation                            options and risks were discussed with the patient.                            All questions were answered, and informed consent                            was obtained. Prior Anticoagulants: The patient has                            taken no anticoagulant or antiplatelet agents. ASA                            Grade Assessment: III - A patient with severe                            systemic disease. After reviewing the risks and                            benefits, the patient was deemed in satisfactory                            condition to undergo the procedure.                           After obtaining informed consent, the endoscope was  passed under direct vision. Throughout the                            procedure, the patient's blood pressure, pulse, and                            oxygen saturations were monitored continuously. The                            GIF [id number] #[id number] was introduced through the                            mouth, and advanced to the third part of duodenum.                            The upper GI  endoscopy was accomplished without                            difficulty. The patient tolerated the procedure                            well. Scope In: Scope Out: Findings:                 Diffuse white plaques were found in the entire                            esophagus. This was most pronounced in the mid and                            upper esophagus. Biopsies were taken with a cold                            forceps for histology. Estimated blood loss was                            minimal.                           The Z-line was regular and was found 36 cm from the                            incisors.                           Atrophic mucosa was found in the gastric fundus and                            in the gastric body. Biopsies were taken with a                            cold forceps for histology. Estimated blood loss                            was minimal.  The incisura and gastric antrum were normal.                            Biopsies were taken with a cold forceps for                            histology. Estimated blood loss was minimal.                           The examined duodenum was normal. Biopsies were                            taken with a cold forceps for histology. Estimated                            blood loss was minimal. Complications:            No immediate complications. Estimated Blood Loss:     Estimated blood loss was minimal. Impression:               - Esophageal plaques were found, suspicious for                            candidiasis. Biopsied.                           - Z-line regular, 36 cm from the incisors.                           - Gastric mucosal atrophy. Biopsied.                           - Normal incisura and antrum. Biopsied.                           - Normal examined duodenum. Biopsied. Recommendation:           - Patient has a contact number available for                            emergencies. The  signs and symptoms of potential                            delayed complications were discussed with the                            patient. Return to normal activities tomorrow.                            Written discharge instructions were provided to the                            patient.                           - Resume previous diet.                           -  Continue present medications.                           - Await pathology results.                           - Diflucan  (fluconazole ) 200 mg x1 day then 100 mg                            PO daily for 3 weeks.                           - Colonoscopy today. Sandor Flatter, MD 06/27/2024 10:23:20 AM

## 2024-06-28 ENCOUNTER — Telehealth: Payer: Self-pay

## 2024-06-28 NOTE — Telephone Encounter (Signed)
  Follow up Call-     06/27/2024    9:02 AM  Call back number  Post procedure Call Back phone  # [phone]  Permission to leave phone message Yes     Patient questions:  Do you have a fever, pain , or abdominal swelling? No. Pain Score  0 *  Have you tolerated food without any problems? Yes.    Have you been able to return to your normal activities? Yes.    Do you have any questions about your discharge instructions: Diet   No. Medications  No. Follow up visit  No.  Do you have questions or concerns about your Care? No.  Actions: * If pain score is 4 or above: No action needed, pain <4.

## 2024-07-01 DIAGNOSIS — M5459 Other low back pain: Secondary | ICD-10-CM | POA: Diagnosis not present

## 2024-07-02 LAB — SURGICAL PATHOLOGY

## 2024-07-03 ENCOUNTER — Ambulatory Visit: Payer: Self-pay | Admitting: Gastroenterology

## 2024-07-07 DIAGNOSIS — M5459 Other low back pain: Secondary | ICD-10-CM | POA: Diagnosis not present

## 2024-07-08 DIAGNOSIS — D485 Neoplasm of uncertain behavior of skin: Secondary | ICD-10-CM | POA: Diagnosis not present

## 2024-07-09 DIAGNOSIS — E785 Hyperlipidemia, unspecified: Secondary | ICD-10-CM | POA: Diagnosis not present

## 2024-07-09 DIAGNOSIS — N184 Chronic kidney disease, stage 4 (severe): Secondary | ICD-10-CM | POA: Diagnosis not present

## 2024-07-09 DIAGNOSIS — Z008 Encounter for other general examination: Secondary | ICD-10-CM | POA: Diagnosis not present

## 2024-07-09 DIAGNOSIS — E1122 Type 2 diabetes mellitus with diabetic chronic kidney disease: Secondary | ICD-10-CM | POA: Diagnosis not present

## 2024-07-22 DIAGNOSIS — M5459 Other low back pain: Secondary | ICD-10-CM | POA: Diagnosis not present

## 2024-08-04 ENCOUNTER — Other Ambulatory Visit: Payer: Self-pay | Admitting: Gastroenterology

## 2024-08-04 DIAGNOSIS — M5459 Other low back pain: Secondary | ICD-10-CM | POA: Diagnosis not present

## 2024-08-05 DIAGNOSIS — D485 Neoplasm of uncertain behavior of skin: Secondary | ICD-10-CM | POA: Diagnosis not present

## 2024-08-14 DIAGNOSIS — E785 Hyperlipidemia, unspecified: Secondary | ICD-10-CM | POA: Diagnosis not present

## 2024-08-14 DIAGNOSIS — Z008 Encounter for other general examination: Secondary | ICD-10-CM | POA: Diagnosis not present

## 2024-08-14 DIAGNOSIS — N184 Chronic kidney disease, stage 4 (severe): Secondary | ICD-10-CM | POA: Diagnosis not present

## 2024-08-14 DIAGNOSIS — E1122 Type 2 diabetes mellitus with diabetic chronic kidney disease: Secondary | ICD-10-CM | POA: Diagnosis not present

## 2024-09-01 DIAGNOSIS — E1165 Type 2 diabetes mellitus with hyperglycemia: Secondary | ICD-10-CM | POA: Diagnosis not present

## 2024-09-02 DIAGNOSIS — D485 Neoplasm of uncertain behavior of skin: Secondary | ICD-10-CM | POA: Diagnosis not present

## 2024-09-11 DIAGNOSIS — E785 Hyperlipidemia, unspecified: Secondary | ICD-10-CM | POA: Diagnosis not present

## 2024-09-11 DIAGNOSIS — E1122 Type 2 diabetes mellitus with diabetic chronic kidney disease: Secondary | ICD-10-CM | POA: Diagnosis not present

## 2024-09-11 DIAGNOSIS — Z008 Encounter for other general examination: Secondary | ICD-10-CM | POA: Diagnosis not present

## 2024-09-11 DIAGNOSIS — N184 Chronic kidney disease, stage 4 (severe): Secondary | ICD-10-CM | POA: Diagnosis not present

## 2024-09-13 DIAGNOSIS — M5459 Other low back pain: Secondary | ICD-10-CM | POA: Diagnosis not present

## 2024-09-30 ENCOUNTER — Encounter: Payer: Self-pay | Admitting: Radiology
